# Patient Record
Sex: Female | Born: 1945 | Race: White | Hispanic: No | Marital: Married | Smoking: Former smoker
Health system: Southern US, Community
[De-identification: ages and names within clinical notes are randomized; demographics above are authoritative.]

## PROBLEM LIST (undated history)

## (undated) DIAGNOSIS — I4510 Unspecified right bundle-branch block: Secondary | ICD-10-CM

## (undated) DIAGNOSIS — R0989 Other specified symptoms and signs involving the circulatory and respiratory systems: Secondary | ICD-10-CM

## (undated) DIAGNOSIS — E785 Hyperlipidemia, unspecified: Secondary | ICD-10-CM

## (undated) DIAGNOSIS — H409 Unspecified glaucoma: Secondary | ICD-10-CM

## (undated) DIAGNOSIS — M47812 Spondylosis without myelopathy or radiculopathy, cervical region: Secondary | ICD-10-CM

## (undated) DIAGNOSIS — K219 Gastro-esophageal reflux disease without esophagitis: Secondary | ICD-10-CM

## (undated) DIAGNOSIS — M419 Scoliosis, unspecified: Secondary | ICD-10-CM

## (undated) DIAGNOSIS — M1990 Unspecified osteoarthritis, unspecified site: Secondary | ICD-10-CM

## (undated) DIAGNOSIS — R42 Dizziness and giddiness: Secondary | ICD-10-CM

## (undated) DIAGNOSIS — I493 Ventricular premature depolarization: Secondary | ICD-10-CM

## (undated) DIAGNOSIS — M8580 Other specified disorders of bone density and structure, unspecified site: Secondary | ICD-10-CM

## (undated) DIAGNOSIS — I10 Essential (primary) hypertension: Secondary | ICD-10-CM

## (undated) HISTORY — DX: Unspecified right bundle-branch block: I45.10

## (undated) HISTORY — DX: Unspecified osteoarthritis, unspecified site: M19.90

## (undated) HISTORY — DX: Hyperlipidemia, unspecified: E78.5

## (undated) HISTORY — DX: Essential (primary) hypertension: I10

## (undated) HISTORY — PX: BREAST BIOPSY: SHX20

## (undated) HISTORY — DX: Dizziness and giddiness: R42

## (undated) HISTORY — DX: Spondylosis without myelopathy or radiculopathy, cervical region: M47.812

## (undated) HISTORY — DX: Unspecified glaucoma: H40.9

## (undated) HISTORY — DX: Scoliosis, unspecified: M41.9

## (undated) HISTORY — DX: Ventricular premature depolarization: I49.3

## (undated) HISTORY — DX: Other specified symptoms and signs involving the circulatory and respiratory systems: R09.89

## (undated) HISTORY — DX: Gastro-esophageal reflux disease without esophagitis: K21.9

## (undated) HISTORY — DX: Other specified disorders of bone density and structure, unspecified site: M85.80

---

## 1967-10-01 HISTORY — PX: OOPHORECTOMY: SHX86

## 1999-05-18 ENCOUNTER — Other Ambulatory Visit: Admission: RE | Admit: 1999-05-18 | Discharge: 1999-05-18 | Payer: Self-pay | Admitting: Obstetrics and Gynecology

## 2000-05-15 ENCOUNTER — Other Ambulatory Visit: Admission: RE | Admit: 2000-05-15 | Discharge: 2000-05-15 | Payer: Self-pay | Admitting: Obstetrics and Gynecology

## 2001-07-17 ENCOUNTER — Other Ambulatory Visit: Admission: RE | Admit: 2001-07-17 | Discharge: 2001-07-17 | Payer: Self-pay | Admitting: Obstetrics and Gynecology

## 2002-08-06 ENCOUNTER — Other Ambulatory Visit: Admission: RE | Admit: 2002-08-06 | Discharge: 2002-08-06 | Payer: Self-pay | Admitting: Obstetrics and Gynecology

## 2002-09-30 HISTORY — PX: ROTATOR CUFF REPAIR: SHX139

## 2003-08-08 ENCOUNTER — Other Ambulatory Visit: Admission: RE | Admit: 2003-08-08 | Discharge: 2003-08-08 | Payer: Self-pay | Admitting: Obstetrics and Gynecology

## 2004-08-15 ENCOUNTER — Other Ambulatory Visit: Admission: RE | Admit: 2004-08-15 | Discharge: 2004-08-15 | Payer: Self-pay | Admitting: Obstetrics and Gynecology

## 2004-11-04 ENCOUNTER — Emergency Department (HOSPITAL_COMMUNITY): Admission: EM | Admit: 2004-11-04 | Discharge: 2004-11-04 | Payer: Self-pay | Admitting: Emergency Medicine

## 2005-08-19 ENCOUNTER — Other Ambulatory Visit: Admission: RE | Admit: 2005-08-19 | Discharge: 2005-08-19 | Payer: Self-pay | Admitting: Obstetrics and Gynecology

## 2006-04-10 ENCOUNTER — Encounter (INDEPENDENT_AMBULATORY_CARE_PROVIDER_SITE_OTHER): Payer: Self-pay | Admitting: Specialist

## 2006-04-10 ENCOUNTER — Encounter: Admission: RE | Admit: 2006-04-10 | Discharge: 2006-04-10 | Payer: Self-pay | Admitting: Internal Medicine

## 2006-09-04 ENCOUNTER — Other Ambulatory Visit: Admission: RE | Admit: 2006-09-04 | Discharge: 2006-09-04 | Payer: Self-pay | Admitting: Obstetrics & Gynecology

## 2006-10-09 ENCOUNTER — Encounter: Admission: RE | Admit: 2006-10-09 | Discharge: 2006-10-09 | Payer: Self-pay | Admitting: Internal Medicine

## 2007-05-05 ENCOUNTER — Encounter: Admission: RE | Admit: 2007-05-05 | Discharge: 2007-05-05 | Payer: Self-pay | Admitting: Internal Medicine

## 2007-06-04 ENCOUNTER — Ambulatory Visit: Payer: Self-pay | Admitting: *Deleted

## 2007-09-29 ENCOUNTER — Other Ambulatory Visit: Admission: RE | Admit: 2007-09-29 | Discharge: 2007-09-29 | Payer: Self-pay | Admitting: Obstetrics and Gynecology

## 2007-11-05 ENCOUNTER — Encounter: Admission: RE | Admit: 2007-11-05 | Discharge: 2007-11-05 | Payer: Self-pay | Admitting: Internal Medicine

## 2008-06-02 ENCOUNTER — Encounter: Admission: RE | Admit: 2008-06-02 | Discharge: 2008-06-02 | Payer: Self-pay | Admitting: Internal Medicine

## 2008-11-22 ENCOUNTER — Other Ambulatory Visit: Admission: RE | Admit: 2008-11-22 | Discharge: 2008-11-22 | Payer: Self-pay | Admitting: Obstetrics & Gynecology

## 2009-07-12 ENCOUNTER — Ambulatory Visit: Payer: Self-pay | Admitting: Vascular Surgery

## 2009-07-18 ENCOUNTER — Encounter: Admission: RE | Admit: 2009-07-18 | Discharge: 2009-07-18 | Payer: Self-pay | Admitting: Internal Medicine

## 2009-09-30 DIAGNOSIS — H409 Unspecified glaucoma: Secondary | ICD-10-CM

## 2009-09-30 HISTORY — DX: Unspecified glaucoma: H40.9

## 2010-01-15 ENCOUNTER — Ambulatory Visit: Payer: Self-pay | Admitting: Vascular Surgery

## 2010-07-25 ENCOUNTER — Encounter: Admission: RE | Admit: 2010-07-25 | Discharge: 2010-07-25 | Payer: Self-pay | Admitting: Internal Medicine

## 2011-02-12 NOTE — Procedures (Signed)
CAROTID DUPLEX EXAM   INDICATION:  Follow up carotid artery disease.   HISTORY:  Diabetes:  No.  Cardiac:  Murmur.  Hypertension:  Off meds now, per patient.  Smoking:  Previous.  Previous Surgery:  No.  CV History:  No.  Amaurosis Fugax No, Paresthesias No, Hemiparesis No.                                       RIGHT             LEFT  Brachial systolic pressure:         152               150  Brachial Doppler waveforms:         WNL               WNL  Vertebral direction of flow:        Antegrade         Antegrade  DUPLEX VELOCITIES (cm/sec)  CCA peak systolic                   100               105  ECA peak systolic                   134               101  ICA peak systolic                   P = 91, D = 243   P = 92, M = 176  ICA end diastolic                   P = 33, D = 78    P = 29, M = 57  PLAQUE MORPHOLOGY:                  Calcific          Calcific  PLAQUE AMOUNT:                      Very mild         Very mild  PLAQUE LOCATION:                    Bifurcation       Bifurcation   IMPRESSION:  1. Distal right internal carotid artery velocities are suggestive of      60% to 79% stenosis.  2. Mid/distal left internal carotid artery velocities are suggestive      of 40% to 59% stenosis.  3. No significant plaque visualized.  4. No significant changes from previous study.  5. Questionable FMD versus tortuous vessels.       ___________________________________________  Janetta Hora Fields, MD   AS/MEDQ  D:  01/15/2010  T:  01/15/2010  Job:  [id number]

## 2011-02-12 NOTE — Procedures (Signed)
CAROTID DUPLEX EXAM   INDICATION:  Followup carotid artery disease.   HISTORY:  Diabetes:  No.  Cardiac:  Murmur.  Hypertension:  Yes.  Smoking:  Quit 20 years ago.  Previous Surgery:  No.  CV History:  No.  Amaurosis Fugax No, Paresthesias No, Hemiparesis No                                       RIGHT             LEFT  Brachial systolic pressure:         138               132  Brachial Doppler waveforms:         Within normal limits                Within normal limits  Vertebral direction of flow:        Antegrade         Antegrade  DUPLEX VELOCITIES (cm/sec)  CCA peak systolic                   94                86  ECA peak systolic                   83                82  ICA peak systolic                   215 distal        196 mid to distal  ICA end diastolic                   48                61  PLAQUE MORPHOLOGY:                  Calcified         Calcified  PLAQUE AMOUNT:                      Very mild         Very mild  PLAQUE LOCATION:                    Below             Below   IMPRESSION:  Right internal carotid artery suggests 60%-79% stenosis on  the low range.  Left internal carotid artery suggests 40%-59% stenosis.  Antegrade flow in both vertebrals.   ___________________________________________  Janetta Hora Fields, MD   CB/MEDQ  D:  07/12/2009  T:  07/12/2009  Job:  [id number]

## 2011-02-12 NOTE — Procedures (Signed)
CAROTID DUPLEX EXAM   INDICATION:  Right carotid bruit.   HISTORY:  Diabetes:  No.  Cardiac:  No.  Hypertension:  Yes.  Smoking:  Quit 20 years ago.  Previous Surgery:  CV History:  Amaurosis Fugax No, Paresthesias No, Hemiparesis No                                       RIGHT             LEFT  Brachial systolic pressure:         130               130  Brachial Doppler waveforms:         Biphasic          Biphasic  Vertebral direction of flow:        Antegrade         Antegrade  DUPLEX VELOCITIES (cm/sec)  CCA peak systolic                   71                79  ECA peak systolic                   79                90  ICA peak systolic                   180 (distal)      183 (mid-to-  distal)  ICA end diastolic                   53                55  PLAQUE MORPHOLOGY:                  None              None  PLAQUE AMOUNT:                      None              None  PLAQUE LOCATION:                    None              None   IMPRESSION:  A 40-59% stenosis noted in bilateral internal carotid  arteries.  Questionable FMD.  Antegrade bilateral vertebral arteries.   ___________________________________________  Di Kindle. Edilia Bo, M.D.   MG/MEDQ  D:  06/04/2007  T:  06/04/2007  Job:  [id number]

## 2011-07-04 ENCOUNTER — Encounter: Payer: Self-pay | Admitting: Cardiovascular Disease

## 2011-07-04 ENCOUNTER — Encounter (INDEPENDENT_AMBULATORY_CARE_PROVIDER_SITE_OTHER): Payer: Medicare Other

## 2011-07-04 ENCOUNTER — Ambulatory Visit (INDEPENDENT_AMBULATORY_CARE_PROVIDER_SITE_OTHER): Payer: Medicare Other | Admitting: Cardiovascular Disease

## 2011-07-04 DIAGNOSIS — R002 Palpitations: Secondary | ICD-10-CM

## 2011-07-04 DIAGNOSIS — I10 Essential (primary) hypertension: Secondary | ICD-10-CM

## 2011-07-04 DIAGNOSIS — R0989 Other specified symptoms and signs involving the circulatory and respiratory systems: Secondary | ICD-10-CM | POA: Insufficient documentation

## 2011-07-04 DIAGNOSIS — I452 Bifascicular block: Secondary | ICD-10-CM

## 2011-07-04 DIAGNOSIS — R0600 Dyspnea, unspecified: Secondary | ICD-10-CM

## 2011-07-04 DIAGNOSIS — E782 Mixed hyperlipidemia: Secondary | ICD-10-CM

## 2011-07-04 DIAGNOSIS — R0609 Other forms of dyspnea: Secondary | ICD-10-CM

## 2011-07-04 NOTE — Assessment & Plan Note (Signed)
Cholesterol is at goal.  Continue current dose of statin and diet Rx.  No myalgias or side effects.  F/U  LFT's in 6 months. No results found for this basename: LDLCALC   F/U labs Perrini.  TC ? 168 per patient

## 2011-07-04 NOTE — Progress Notes (Signed)
65 yo referred by Dr Marveen Reeks for palpitations.  History of borderline BP, elevated lipids.  Carotids followed by Dr Darrick Penna.  ? FMD  Last duplex reviewed  01/15/10 with peak RICA velocity 2.4 m/sec and left 1.22m/sec.  Previous HTN.  Lopresser stopped 3 years ago.  Lost 25lbs when she retired and did not need meds anymore.  Elevated lipids well controlled on zocor.  Recent labs with primary ok.  Previous palpitations many years ago.  Returned the last 3 weeks.  Daily.  Not necessarily worse with exercise.  Can make her some dyspnic.  No syncope.  Some pressure in chest but not pain.  Goes to [employer] regularly and not worse there.  Has tried to cut back on caffeine.  ROS: Denies fever, malais, weight loss, blurry vision, decreased visual acuity, cough, sputum, SOB, hemoptysis, pleuritic pain, palpitaitons, heartburn, abdominal pain, melena, lower extremity edema, claudication, or rash.  All other systems reviewed and negative   General: Affect appropriate Healthy:  appears stated age HEENT: normal Neck supple with no adenopathy JVP normal right  bruits no thyromegaly Lungs clear with no wheezing and good diaphragmatic motion Heart:  S1/S2 no murmur,rub, gallop or click PMI normal Abdomen: benighn, BS positve, no tenderness, no AAA no bruit.  No HSM or HJR Distal pulses intact with no bruits No edema Neuro non-focal Skin warm and dry No muscular weakness  Medications Current Outpatient Prescriptions  Medication Sig Dispense Refill  . aspirin 81 MG tablet Take 81 mg by mouth daily.        . calcium carbonate 200 MG capsule Take 250 mg by mouth daily.        Marland Kitchen estradiol (ESTRACE VAGINAL) 0.1 MG/GM vaginal cream Place 2 g vaginally.        . fish oil-omega-3 fatty acids 1000 MG capsule Take 2 g by mouth. Once a week      . Glucosamine HCl 1000 MG TABS Take 1 tablet by mouth daily.        Marland Kitchen latanoprost (XALATAN) 0.005 % ophthalmic solution Place 1 drop into both eyes at bedtime.          . simvastatin (ZOCOR) 20 MG tablet Take 20 mg by mouth at bedtime.          Allergies Penicillins and Sulfa antibiotics  Family History: No family history on file.  Social History: History   Social History  . Marital Status: Married    Spouse Name: N/A    Number of Children: 2  . Years of Education: N/A   Occupational History  . LPN [employer]   Social History Main Topics  . Smoking status: Former Smoker    Quit date: 10/03/1985  . Smokeless tobacco: Not on file  . Alcohol Use: Yes  . Drug Use: No  . Sexually Active: Not on file   Other Topics Concern  . Not on file   Social History Narrative   Works out at [employer] 5/weekFormer smokerOccasional ETOHMarried    Electrocardiogram:  NSR 77 Biatrial enlargement RBBB  Assessment and Plan

## 2011-07-04 NOTE — Assessment & Plan Note (Signed)
Well controlled.  Continue current medications and low sodium Dash type diet.  Monitor at home

## 2011-07-04 NOTE — Assessment & Plan Note (Signed)
Will do duplex in our office.  ? FMD or tortuous vessels.  Assess intimal thickening

## 2011-07-04 NOTE — Patient Instructions (Signed)
Your physician recommends that you schedule a follow-up appointment in: 3-4 WEEKS AFTER TEST DONE WITH DR Eden Emms  Your physician recommends that you continue on your current medications as directed. Please refer to the Current Medication list given to you today.  Your physician has recommended that you wear an event monitor. Event monitors are medical devices that record the heart's electrical activity. Doctors most often [place] these monitors to diagnose arrhythmias. Arrhythmias are problems with the speed or rhythm of the heartbeat. The monitor is a small, portable device. You can wear one while you do your normal daily activities. This is usually used to diagnose what is causing palpitations/syncope (passing out). DX 785.1 Your physician has requested that you have a carotid duplex. This test is an ultrasound of the carotid arteries in your neck. It looks at blood flow through these arteries that supply the brain with blood. Allow one hour for this exam. There are no restrictions or special instructions. DX BRUIT  Your physician has requested that you have a stress echocardiogram. For further information please visit [email or web address] Please follow instruction sheet as given. DX 785.1  DYSPNEA

## 2011-07-04 NOTE — Assessment & Plan Note (Signed)
Event monitor and stress echo to assess and R/O structural heart disease

## 2011-07-04 NOTE — Assessment & Plan Note (Signed)
Will try to get old records.  Echo monitor for palpitations  No high grade block

## 2011-07-12 ENCOUNTER — Other Ambulatory Visit: Payer: Self-pay | Admitting: Internal Medicine

## 2011-07-12 DIAGNOSIS — Z1231 Encounter for screening mammogram for malignant neoplasm of breast: Secondary | ICD-10-CM

## 2011-07-29 ENCOUNTER — Encounter: Payer: Self-pay | Admitting: *Deleted

## 2011-07-30 ENCOUNTER — Encounter (INDEPENDENT_AMBULATORY_CARE_PROVIDER_SITE_OTHER): Payer: Medicare Other | Admitting: *Deleted

## 2011-07-30 ENCOUNTER — Other Ambulatory Visit (HOSPITAL_COMMUNITY): Payer: BLUE CROSS/BLUE SHIELD | Admitting: Radiology

## 2011-07-30 ENCOUNTER — Encounter: Payer: BLUE CROSS/BLUE SHIELD | Admitting: *Deleted

## 2011-07-30 ENCOUNTER — Ambulatory Visit (HOSPITAL_COMMUNITY): Payer: Medicare Other | Attending: Cardiovascular Disease | Admitting: Radiology

## 2011-07-30 ENCOUNTER — Other Ambulatory Visit (HOSPITAL_COMMUNITY): Payer: BLUE CROSS/BLUE SHIELD

## 2011-07-30 DIAGNOSIS — E785 Hyperlipidemia, unspecified: Secondary | ICD-10-CM | POA: Insufficient documentation

## 2011-07-30 DIAGNOSIS — R0989 Other specified symptoms and signs involving the circulatory and respiratory systems: Secondary | ICD-10-CM

## 2011-07-30 DIAGNOSIS — R000 Tachycardia, unspecified: Secondary | ICD-10-CM | POA: Insufficient documentation

## 2011-07-30 DIAGNOSIS — R002 Palpitations: Secondary | ICD-10-CM | POA: Insufficient documentation

## 2011-07-30 DIAGNOSIS — R072 Precordial pain: Secondary | ICD-10-CM | POA: Insufficient documentation

## 2011-07-30 DIAGNOSIS — I6529 Occlusion and stenosis of unspecified carotid artery: Secondary | ICD-10-CM

## 2011-07-30 DIAGNOSIS — R5381 Other malaise: Secondary | ICD-10-CM | POA: Insufficient documentation

## 2011-07-30 DIAGNOSIS — I10 Essential (primary) hypertension: Secondary | ICD-10-CM | POA: Insufficient documentation

## 2011-07-30 DIAGNOSIS — Z87891 Personal history of nicotine dependence: Secondary | ICD-10-CM | POA: Insufficient documentation

## 2011-07-30 DIAGNOSIS — Z8249 Family history of ischemic heart disease and other diseases of the circulatory system: Secondary | ICD-10-CM | POA: Insufficient documentation

## 2011-07-30 DIAGNOSIS — R5383 Other fatigue: Secondary | ICD-10-CM | POA: Insufficient documentation

## 2011-07-31 ENCOUNTER — Encounter: Payer: Self-pay | Admitting: *Deleted

## 2011-07-31 ENCOUNTER — Encounter: Payer: Self-pay | Admitting: Cardiovascular Disease

## 2011-07-31 ENCOUNTER — Ambulatory Visit (INDEPENDENT_AMBULATORY_CARE_PROVIDER_SITE_OTHER): Payer: Medicare Other | Admitting: Cardiovascular Disease

## 2011-07-31 DIAGNOSIS — R002 Palpitations: Secondary | ICD-10-CM

## 2011-07-31 DIAGNOSIS — E782 Mixed hyperlipidemia: Secondary | ICD-10-CM

## 2011-07-31 DIAGNOSIS — I10 Essential (primary) hypertension: Secondary | ICD-10-CM

## 2011-07-31 DIAGNOSIS — R0989 Other specified symptoms and signs involving the circulatory and respiratory systems: Secondary | ICD-10-CM

## 2011-07-31 NOTE — Assessment & Plan Note (Signed)
Well controlled.  Continue current medications and low sodium Dash type diet.    

## 2011-07-31 NOTE — Telephone Encounter (Signed)
This encounter was created in error - please disregard.

## 2011-07-31 NOTE — Assessment & Plan Note (Signed)
Benign with normal event monitor and stress echo. Observe

## 2011-07-31 NOTE — Patient Instructions (Signed)
Your physician recommends that you schedule a follow-up appointment in: PRN 

## 2011-07-31 NOTE — Assessment & Plan Note (Signed)
Cholesterol is at goal.  Continue current dose of statin and diet Rx.  No myalgias or side effects.  F/U  LFT's in 6 months. No results found for this basename: LDLCALC             

## 2011-07-31 NOTE — Assessment & Plan Note (Signed)
F/U duplex in a year ? FMD  ASA

## 2011-07-31 NOTE — Progress Notes (Signed)
65 yo referred by Dr Marveen Reeks for palpitations. History of borderline BP, elevated lipids. Carotids followed by Dr Darrick Penna. ? FMD Last duplex reviewed 01/15/10 with peak RICA velocity 2.4 m/sec and left 1.2m/sec. Previous HTN. Lopresser stopped 3 years ago. Lost 25lbs when she retired and did not need meds anymore. Elevated lipids well controlled on zocor. Recent labs with primary ok. Previous palpitations many years ago. Returned the last 3 weeks. Daily. Not necessarily worse with exercise. Can make her some dyspnic. No syncope. Some pressure in chest but not pain. Goes to [employer] regularly and not worse there. Has tried to cut back on caffeine.  Normal stress echo 10/30  Reviewed Event monitor reviewed 10/4-10/24  No arrhythmia  Even with palpitations NSR  Since last visit "spells" lest often  Feels heart beating hard.  ROS: Denies fever, malais, weight loss, blurry vision, decreased visual acuity, cough, sputum, SOB, hemoptysis, pleuritic pain, palpitaitons, heartburn, abdominal pain, melena, lower extremity edema, claudication, or rash.  All other systems reviewed and negative  General: Affect appropriate Healthy:  appears stated age HEENT: normal Neck supple with no adenopathy JVP normal no bruits no thyromegaly Lungs clear with no wheezing and good diaphragmatic motion Heart:  S1/S2 no murmur,rub, gallop or click PMI normal Abdomen: benighn, BS positve, no tenderness, no AAA no bruit.  No HSM or HJR Distal pulses intact with no bruits No edema Neuro non-focal Skin warm and dry No muscular weakness   Current Outpatient Prescriptions  Medication Sig Dispense Refill  . aspirin 81 MG tablet Take 81 mg by mouth daily.        . calcium carbonate 200 MG capsule Take 250 mg by mouth daily.        Marland Kitchen estradiol (ESTRACE VAGINAL) 0.1 MG/GM vaginal cream Place 2 g vaginally.        Marland Kitchen estrogen, conjugated,-medroxyprogesterone (PREMPRO) 0.3-1.5 MG per tablet Take 1 tablet by mouth daily.         . fish oil-omega-3 fatty acids 1000 MG capsule Take 2 g by mouth. Once a week      . latanoprost (XALATAN) 0.005 % ophthalmic solution Place 1 drop into both eyes at bedtime.        . Multiple Vitamin (MULTIVITAMIN) capsule Take 1 capsule by mouth daily.        . simvastatin (ZOCOR) 20 MG tablet Take 20 mg by mouth at bedtime.          Allergies  Penicillins and Sulfa antibiotics  Electrocardiogram:  Assessment and Plan

## 2011-08-08 ENCOUNTER — Ambulatory Visit
Admission: RE | Admit: 2011-08-08 | Discharge: 2011-08-08 | Disposition: A | Discharge status: Home / Self Care | Payer: Medicare Other | Source: Ambulatory Visit | Attending: Internal Medicine | Admitting: Internal Medicine

## 2011-08-08 DIAGNOSIS — Z1231 Encounter for screening mammogram for malignant neoplasm of breast: Secondary | ICD-10-CM

## 2011-10-08 DIAGNOSIS — E785 Hyperlipidemia, unspecified: Secondary | ICD-10-CM | POA: Diagnosis not present

## 2011-10-08 DIAGNOSIS — M899 Disorder of bone, unspecified: Secondary | ICD-10-CM | POA: Diagnosis not present

## 2011-10-08 DIAGNOSIS — M949 Disorder of cartilage, unspecified: Secondary | ICD-10-CM | POA: Diagnosis not present

## 2011-10-08 DIAGNOSIS — I10 Essential (primary) hypertension: Secondary | ICD-10-CM | POA: Diagnosis not present

## 2011-10-15 DIAGNOSIS — Z0000 Encounter for general adult medical examination without abnormal findings: Secondary | ICD-10-CM | POA: Diagnosis not present

## 2011-10-15 DIAGNOSIS — E785 Hyperlipidemia, unspecified: Secondary | ICD-10-CM | POA: Diagnosis not present

## 2011-10-15 DIAGNOSIS — N951 Menopausal and female climacteric states: Secondary | ICD-10-CM | POA: Diagnosis not present

## 2011-10-15 DIAGNOSIS — H6120 Impacted cerumen, unspecified ear: Secondary | ICD-10-CM | POA: Diagnosis not present

## 2011-10-15 DIAGNOSIS — Z23 Encounter for immunization: Secondary | ICD-10-CM | POA: Diagnosis not present

## 2011-10-22 DIAGNOSIS — H4011X0 Primary open-angle glaucoma, stage unspecified: Secondary | ICD-10-CM | POA: Diagnosis not present

## 2011-11-29 LAB — HM DEXA SCAN

## 2011-12-10 DIAGNOSIS — M899 Disorder of bone, unspecified: Secondary | ICD-10-CM | POA: Diagnosis not present

## 2011-12-10 DIAGNOSIS — M949 Disorder of cartilage, unspecified: Secondary | ICD-10-CM | POA: Diagnosis not present

## 2011-12-23 DIAGNOSIS — Z0000 Encounter for general adult medical examination without abnormal findings: Secondary | ICD-10-CM | POA: Diagnosis not present

## 2011-12-23 DIAGNOSIS — Z124 Encounter for screening for malignant neoplasm of cervix: Secondary | ICD-10-CM | POA: Diagnosis not present

## 2011-12-23 DIAGNOSIS — Z01419 Encounter for gynecological examination (general) (routine) without abnormal findings: Secondary | ICD-10-CM | POA: Diagnosis not present

## 2012-02-26 DIAGNOSIS — H409 Unspecified glaucoma: Secondary | ICD-10-CM | POA: Diagnosis not present

## 2012-02-26 DIAGNOSIS — H4011X0 Primary open-angle glaucoma, stage unspecified: Secondary | ICD-10-CM | POA: Diagnosis not present

## 2012-07-14 DIAGNOSIS — H409 Unspecified glaucoma: Secondary | ICD-10-CM | POA: Diagnosis not present

## 2012-07-14 DIAGNOSIS — H4011X0 Primary open-angle glaucoma, stage unspecified: Secondary | ICD-10-CM | POA: Diagnosis not present

## 2012-07-27 DIAGNOSIS — J069 Acute upper respiratory infection, unspecified: Secondary | ICD-10-CM | POA: Diagnosis not present

## 2012-07-27 DIAGNOSIS — J309 Allergic rhinitis, unspecified: Secondary | ICD-10-CM | POA: Diagnosis not present

## 2012-07-28 ENCOUNTER — Other Ambulatory Visit: Payer: Self-pay | Admitting: Internal Medicine

## 2012-07-28 DIAGNOSIS — Z1231 Encounter for screening mammogram for malignant neoplasm of breast: Secondary | ICD-10-CM

## 2012-08-10 DIAGNOSIS — K5730 Diverticulosis of large intestine without perforation or abscess without bleeding: Secondary | ICD-10-CM | POA: Diagnosis not present

## 2012-08-10 DIAGNOSIS — K5289 Other specified noninfective gastroenteritis and colitis: Secondary | ICD-10-CM | POA: Diagnosis not present

## 2012-08-10 DIAGNOSIS — K921 Melena: Secondary | ICD-10-CM | POA: Diagnosis not present

## 2012-08-10 DIAGNOSIS — K219 Gastro-esophageal reflux disease without esophagitis: Secondary | ICD-10-CM | POA: Diagnosis not present

## 2012-08-20 DIAGNOSIS — R198 Other specified symptoms and signs involving the digestive system and abdomen: Secondary | ICD-10-CM | POA: Diagnosis not present

## 2012-08-20 DIAGNOSIS — R197 Diarrhea, unspecified: Secondary | ICD-10-CM | POA: Diagnosis not present

## 2012-09-04 DIAGNOSIS — Z23 Encounter for immunization: Secondary | ICD-10-CM | POA: Diagnosis not present

## 2012-09-07 ENCOUNTER — Ambulatory Visit
Admission: RE | Admit: 2012-09-07 | Discharge: 2012-09-07 | Disposition: A | Discharge status: Home / Self Care | Payer: Medicare Other | Source: Ambulatory Visit | Attending: Internal Medicine | Admitting: Internal Medicine

## 2012-09-07 DIAGNOSIS — Z1231 Encounter for screening mammogram for malignant neoplasm of breast: Secondary | ICD-10-CM

## 2012-09-30 HISTORY — PX: CATARACT EXTRACTION: SUR2

## 2012-11-18 ENCOUNTER — Encounter: Payer: Self-pay | Admitting: Nurse Practitioner

## 2012-11-18 DIAGNOSIS — H4011X0 Primary open-angle glaucoma, stage unspecified: Secondary | ICD-10-CM | POA: Diagnosis not present

## 2012-11-18 DIAGNOSIS — H52209 Unspecified astigmatism, unspecified eye: Secondary | ICD-10-CM | POA: Diagnosis not present

## 2012-11-18 DIAGNOSIS — H2510 Age-related nuclear cataract, unspecified eye: Secondary | ICD-10-CM | POA: Diagnosis not present

## 2012-11-18 DIAGNOSIS — M1990 Unspecified osteoarthritis, unspecified site: Secondary | ICD-10-CM | POA: Insufficient documentation

## 2012-11-18 DIAGNOSIS — H409 Unspecified glaucoma: Secondary | ICD-10-CM | POA: Diagnosis not present

## 2012-11-23 DIAGNOSIS — M5137 Other intervertebral disc degeneration, lumbosacral region: Secondary | ICD-10-CM | POA: Diagnosis not present

## 2012-11-23 DIAGNOSIS — M949 Disorder of cartilage, unspecified: Secondary | ICD-10-CM | POA: Diagnosis not present

## 2012-11-23 DIAGNOSIS — Z043 Encounter for examination and observation following other accident: Secondary | ICD-10-CM | POA: Diagnosis not present

## 2012-11-23 DIAGNOSIS — M899 Disorder of bone, unspecified: Secondary | ICD-10-CM | POA: Diagnosis not present

## 2012-12-14 DIAGNOSIS — E785 Hyperlipidemia, unspecified: Secondary | ICD-10-CM | POA: Diagnosis not present

## 2012-12-14 DIAGNOSIS — M949 Disorder of cartilage, unspecified: Secondary | ICD-10-CM | POA: Diagnosis not present

## 2012-12-14 DIAGNOSIS — I10 Essential (primary) hypertension: Secondary | ICD-10-CM | POA: Diagnosis not present

## 2012-12-14 DIAGNOSIS — M899 Disorder of bone, unspecified: Secondary | ICD-10-CM | POA: Diagnosis not present

## 2012-12-14 DIAGNOSIS — I739 Peripheral vascular disease, unspecified: Secondary | ICD-10-CM | POA: Diagnosis not present

## 2012-12-14 DIAGNOSIS — Z79899 Other long term (current) drug therapy: Secondary | ICD-10-CM | POA: Diagnosis not present

## 2012-12-21 DIAGNOSIS — Z0000 Encounter for general adult medical examination without abnormal findings: Secondary | ICD-10-CM | POA: Diagnosis not present

## 2012-12-21 DIAGNOSIS — M545 Low back pain: Secondary | ICD-10-CM | POA: Diagnosis not present

## 2012-12-21 DIAGNOSIS — R002 Palpitations: Secondary | ICD-10-CM | POA: Diagnosis not present

## 2012-12-21 DIAGNOSIS — K5730 Diverticulosis of large intestine without perforation or abscess without bleeding: Secondary | ICD-10-CM | POA: Diagnosis not present

## 2012-12-21 DIAGNOSIS — Z79899 Other long term (current) drug therapy: Secondary | ICD-10-CM | POA: Diagnosis not present

## 2012-12-21 DIAGNOSIS — I6529 Occlusion and stenosis of unspecified carotid artery: Secondary | ICD-10-CM | POA: Diagnosis not present

## 2012-12-21 DIAGNOSIS — I4510 Unspecified right bundle-branch block: Secondary | ICD-10-CM | POA: Diagnosis not present

## 2012-12-21 DIAGNOSIS — K219 Gastro-esophageal reflux disease without esophagitis: Secondary | ICD-10-CM | POA: Diagnosis not present

## 2012-12-21 DIAGNOSIS — J309 Allergic rhinitis, unspecified: Secondary | ICD-10-CM | POA: Diagnosis not present

## 2012-12-29 ENCOUNTER — Encounter: Payer: Self-pay | Admitting: Nurse Practitioner

## 2012-12-29 ENCOUNTER — Ambulatory Visit (INDEPENDENT_AMBULATORY_CARE_PROVIDER_SITE_OTHER): Payer: Medicare Other | Admitting: Nurse Practitioner

## 2012-12-29 VITALS — BP 118/68 | HR 70 | Resp 16 | Ht 63.75 in | Wt 147.0 lb

## 2012-12-29 DIAGNOSIS — N952 Postmenopausal atrophic vaginitis: Secondary | ICD-10-CM | POA: Diagnosis not present

## 2012-12-29 DIAGNOSIS — N951 Menopausal and female climacteric states: Secondary | ICD-10-CM

## 2012-12-29 DIAGNOSIS — Z01419 Encounter for gynecological examination (general) (routine) without abnormal findings: Secondary | ICD-10-CM | POA: Diagnosis not present

## 2012-12-29 MED ORDER — ESTRADIOL 0.1 MG/GM VA CREA: TOPICAL_CREAM | VAGINAL | Status: DC

## 2012-12-29 MED ORDER — CONJ ESTROG-MEDROXYPROGEST ACE 0.3-1.5 MG PO TABS
1.0000 | ORAL_TABLET | Freq: Every day | ORAL | Status: DC
Start: 2012-12-29 — End: 2014-01-04

## 2012-12-29 NOTE — Patient Instructions (Addendum)
Atrophic Vaginitis Atrophic vaginitis is a problem of low levels of estrogen in women. This problem can happen at any age. It is most common in women who have gone through menopause ("the change").  HOW WILL I KNOW IF I HAVE THIS PROBLEM? You may have:  Trouble with peeing (urinating), such as:  Going to the bathroom often.  A hard time holding your pee until you reach a bathroom.  Leaking pee.  Having pain when you pee.  Itching or a burning feeling.  Vaginal bleeding and spotting.  Pain during sex.  Dryness of the vagina.  A yellow, bad-smelling fluid (discharge) coming from the vagina. HOW WILL MY DOCTOR CHECK FOR THIS PROBLEM?  During your exam, your doctor will likely find the problem.  If there is a vaginal fluid, it may be checked for infection. HOW WILL THIS PROBLEM BE TREATED? Keep the vulvar skin as clean as possible. Moisturizers and lubricants can help with some of the symptoms. Estrogen replacement can help. There are 2 ways to take estrogen:  Systemic estrogen gets estrogen to your whole body. It takes many weeks or months before the symptoms get better.  You take an estrogen pill.  You use a skin patch. This is a patch that you put on your skin.  If you still have your uterus, your doctor may ask you to take a hormone. Talk to your doctor about the right medicine for you.  Estrogen cream.  This puts estrogen only at the part of your body where you apply it. The cream is put into the vagina or put on the vulvar skin. For some women, estrogen cream works faster than pills or the patch. CAN ALL WOMEN WITH THIS PROBLEM USE ESTROGEN? No. Women with certain types of cancer, liver problems, or problems with blood clots should not take estrogen. Your doctor can help you decide the best treatment for your symptoms. Document Released: 03/04/2008 Document Revised: 12/09/2011 Document Reviewed: 03/04/2008 ExitCare Patient Information 2013 [employer].  

## 2012-12-29 NOTE — Progress Notes (Signed)
67 y.o. MarriedCaucasian female   [phone] here for annual exam.  Has been trying to decrease oral estrogen to every other day and wean off, but has had an increase in mood changes that she went back to regular dose. She had a bout of colitis and had to take antibiotics in  Nov. 2013.  Also related to stress.  Her daughter has an eating disorder and is living with them and the grandchild until she gets therapy. The goal is to get her back living with the husband.  No LMP recorded. Patient is postmenopausal.          Sexually active: no  The current method of family planning is none.    Exercising: no  none Last mammogram: 09/07/12 Last pap: 12/23/11 Last BMD: 11/2011 Osteopenia off Fosamax since 2007 Alcohol: occ. wine Tobacco: No   Health Maintenance  Topic Date Due  . Influenza Vaccine  05/31/1946  . Tetanus/tdap  01/17/1963  . Colonoscopy  01/16/1994  . Mammogram  09/07/2014  . Pneumococcal Polysaccharide Vaccine Age 68 And Over  Completed  . Zostavax  Completed    No family history on file.  Patient Active Problem List  Diagnosis  . Bruit  . HTN (hypertension)  . Mixed hyperlipidemia  . RBBB (right bundle branch block with left anterior fascicular block)  . Palpitations  . Osteoarthritis    Past Medical History  Diagnosis Date  . Carotid bruit   . GERD (gastroesophageal reflux disease)   . Symptomatic PVCs   . Arthritis   . Elevated lipids   . RBBB (right bundle branch block)   . Hypertension   . Osteoarthritis     Past Surgical History  Procedure Laterality Date  . Appendectomy    . Rotator cuff repair Right 09/2002  . Breast biopsy    . Other surgical history      LSO-Dermoid  . Other surgical history Left 11/1995    Core Biopsy    Allergies: Erythromycin; Penicillins; and Sulfa antibiotics  Current Outpatient Prescriptions  Medication Sig Dispense Refill  . estradiol (ESTRACE VAGINAL) 0.1 MG/GM vaginal cream Place 2 g vaginally.        Marland Kitchen estrogen,  conjugated,-medroxyprogesterone (PREMPRO) 0.3-1.5 MG per tablet Take 1 tablet by mouth daily.        Marland Kitchen latanoprost (XALATAN) 0.005 % ophthalmic solution Place 1 drop into both eyes at bedtime.        . Multiple Vitamin (MULTIVITAMIN) capsule Take 1 capsule by mouth daily.        . simvastatin (ZOCOR) 20 MG tablet Take 20 mg by mouth at bedtime.        Marland Kitchen aspirin 81 MG tablet Take 81 mg by mouth daily.        . calcium carbonate 200 MG capsule Take 250 mg by mouth daily.        . fish oil-omega-3 fatty acids 1000 MG capsule Take 2 g by mouth. Once a week       No current facility-administered medications for this visit.    ROS: A comprehensive review of systems was negative.  Exam:    BP 118/68  Pulse 70  Resp 16  Ht 5' 3.75" (1.619 m)  Wt 147 lb (66.679 kg)  BMI 25.44 kg/m2 Weight change: @WEIGHTCHANGE @ Last 3 height recordings:  Ht Readings from Last 3 Encounters:  12/29/12 5' 3.75" (1.619 m)  07/31/11 5\' 4"  (1.626 m)  07/04/11 5' 4.5" (1.638 m)   General appearance: alert, cooperative,  appears stated age and anxious Head: Normocephalic, without obvious abnormality Neck: no adenopathy, supple, symmetrical, trachea midline and thyroid not enlarged, symmetric, no tenderness/mass/nodules Lungs: clear to auscultation bilaterally Breasts: normal appearance, no masses or tenderness Heart: regular rate and rhythm, S1, S2 normal, no murmur, click, rub or gallop Abdomen: soft, non-tender; bowel sounds normal; no masses,  no organomegaly Extremities: extremities normal, atraumatic, no cyanosis or edema Skin: Skin color, texture, turgor normal. No rashes or lesions Lymph nodes: Cervical, supraclavicular, and axillary nodes normal. no inguinal nodes palpated Neurologic: Grossly normal   Pelvic: External genitalia:  no lesions              Urethra: normal appearing urethra with no masses, tenderness or lesions              Bartholins and Skenes: normal                 Vagina: atrophic               Cervix: normal appearance              Pap taken: no        Bimanual Exam:  Uterus:  uterus is normal size, shape, consistency and nontender                                      Adnexa:    normal adnexa in size, nontender and no masses                                      Rectovaginal: Confirms                                      Anus:  normal sphincter tone, no lesions  A:  GYN well woman exam  Postmenopausal on HRT  no contraindication to continue hormonal therapy  Situational depression and anxiety  History of Bundle Branch Block     P:  Mammogram due 12/14  pap smear as per guidlines  counseled on breast self exam, mammography screening,  use and side effects of HRT, adequate intake of calcium and  vitamin D, diet and exercise, Kegel's exercises    Return annually or prn      An After Visit Summary was printed and given to the patient.

## 2013-01-03 NOTE — Progress Notes (Signed)
Encounter reviewed by Dr. Shareta Fishbaugh Silva.  

## 2013-06-21 DIAGNOSIS — H409 Unspecified glaucoma: Secondary | ICD-10-CM | POA: Diagnosis not present

## 2013-06-21 DIAGNOSIS — H4011X0 Primary open-angle glaucoma, stage unspecified: Secondary | ICD-10-CM | POA: Diagnosis not present

## 2013-06-30 ENCOUNTER — Encounter: Payer: Self-pay | Admitting: Nurse Practitioner

## 2013-07-14 DIAGNOSIS — Z23 Encounter for immunization: Secondary | ICD-10-CM | POA: Diagnosis not present

## 2013-07-19 DIAGNOSIS — H4011X0 Primary open-angle glaucoma, stage unspecified: Secondary | ICD-10-CM | POA: Diagnosis not present

## 2013-07-19 DIAGNOSIS — H409 Unspecified glaucoma: Secondary | ICD-10-CM | POA: Diagnosis not present

## 2013-09-01 DIAGNOSIS — H409 Unspecified glaucoma: Secondary | ICD-10-CM | POA: Diagnosis not present

## 2013-09-01 DIAGNOSIS — H4011X0 Primary open-angle glaucoma, stage unspecified: Secondary | ICD-10-CM | POA: Diagnosis not present

## 2013-09-15 ENCOUNTER — Other Ambulatory Visit: Payer: Self-pay

## 2013-09-15 DIAGNOSIS — Z1231 Encounter for screening mammogram for malignant neoplasm of breast: Secondary | ICD-10-CM

## 2013-09-21 ENCOUNTER — Ambulatory Visit
Admission: RE | Admit: 2013-09-21 | Discharge: 2013-09-21 | Disposition: A | Discharge status: Home / Self Care | Payer: Medicare Other | Source: Ambulatory Visit

## 2013-09-21 DIAGNOSIS — Z1231 Encounter for screening mammogram for malignant neoplasm of breast: Secondary | ICD-10-CM

## 2013-12-27 DIAGNOSIS — M899 Disorder of bone, unspecified: Secondary | ICD-10-CM | POA: Diagnosis not present

## 2013-12-27 DIAGNOSIS — M949 Disorder of cartilage, unspecified: Secondary | ICD-10-CM | POA: Diagnosis not present

## 2013-12-27 DIAGNOSIS — I10 Essential (primary) hypertension: Secondary | ICD-10-CM | POA: Diagnosis not present

## 2013-12-27 DIAGNOSIS — E785 Hyperlipidemia, unspecified: Secondary | ICD-10-CM | POA: Diagnosis not present

## 2013-12-31 ENCOUNTER — Ambulatory Visit: Payer: BC Managed Care – PPO | Admitting: Nurse Practitioner

## 2014-01-04 ENCOUNTER — Ambulatory Visit (INDEPENDENT_AMBULATORY_CARE_PROVIDER_SITE_OTHER): Payer: Medicare Other | Admitting: Nurse Practitioner

## 2014-01-04 ENCOUNTER — Encounter: Payer: Self-pay | Admitting: Nurse Practitioner

## 2014-01-04 VITALS — BP 106/64 | HR 64 | Ht 63.75 in | Wt 158.0 lb

## 2014-01-04 DIAGNOSIS — N951 Menopausal and female climacteric states: Secondary | ICD-10-CM | POA: Diagnosis not present

## 2014-01-04 DIAGNOSIS — Z0000 Encounter for general adult medical examination without abnormal findings: Secondary | ICD-10-CM | POA: Diagnosis not present

## 2014-01-04 DIAGNOSIS — Z23 Encounter for immunization: Secondary | ICD-10-CM | POA: Diagnosis not present

## 2014-01-04 DIAGNOSIS — Z01419 Encounter for gynecological examination (general) (routine) without abnormal findings: Secondary | ICD-10-CM | POA: Diagnosis not present

## 2014-01-04 DIAGNOSIS — Z124 Encounter for screening for malignant neoplasm of cervix: Secondary | ICD-10-CM

## 2014-01-04 MED ORDER — ESTRADIOL 0.1 MG/GM VA CREA: TOPICAL_CREAM | VAGINAL | Status: DC

## 2014-01-04 NOTE — Progress Notes (Signed)
Patient ID: Judith [name], female   DOB: June 17, 1946, 68 y.o.   MRN: [id number] 67 y.o. [id number] Married Caucasian Fe here for annual exam. Doing well with vaginal estrogen - now only using 1 time a week.  Has not used Vit E capsule intravaginally but plans to do so. She did well with decreasing HRT and coming off last year.  Several family members with multiple health issues and traveling a lot to [place] to care for them.  Patient's last menstrual period was 09/30/1993.          Sexually active: yes  The current method of family planning is post menopausal status.    Exercising: yes  Home exercise routine includes weight training three times per week at gym and walking everyday.. Smoker:  no  Health Maintenance: Pap:  12/23/11, WNL MMG:  09/21/13, Bi-Rads 1: negative Colonoscopy:  11/21/09, adenoma; recall in 5 years BMD:  3/13, -1.9/-1.7/-1.5 scheduled 02/2014 at [facility] TD: 2006 PCP wants her to get TDaP Shingles: 2007 Pneumovax 2004; 2013 Labs:  PCP   reports that she quit smoking about 30 years ago. She has never used smokeless tobacco. She reports that she drinks about 0.5 ounces of alcohol per week. She reports that she does not use illicit drugs.  Past Medical History  Diagnosis Date  . Carotid bruit   . GERD (gastroesophageal reflux disease)   . Symptomatic PVCs   . Arthritis   . Elevated lipids   . RBBB (right bundle branch block)   . Hypertension   . Osteoarthritis     Past Surgical History  Procedure Laterality Date  . Rotator cuff repair Right 09/2002  . Refractive surgery Left 2014  . Oophorectomy Left 1969    secondary to dermoid  . Breast biopsy Left 3/97    Current Outpatient Prescriptions  Medication Sig Dispense Refill  . aspirin 81 MG tablet Take 81 mg by mouth daily.        . calcium carbonate 200 MG capsule Take 250 mg by mouth daily.        Marland Kitchen estradiol (ESTRACE) 0.1 MG/GM vaginal cream Use 1 g vaginally twice weekly  42.5 g  3  .  latanoprost (XALATAN) 0.005 % ophthalmic solution Place 1 drop into both eyes at bedtime.        . Multiple Vitamin (MULTIVITAMIN) capsule Take 1 capsule by mouth daily.        . simvastatin (ZOCOR) 20 MG tablet Take 10 mg by mouth at bedtime.       . timolol (TIMOPTIC) 0.5 % ophthalmic solution Place into both eyes daily.       No current facility-administered medications for this visit.    Family History  Problem Relation Age of Onset  . Cancer Mother 40    throat  . Hypertension Father   . Heart disease Father 23  . Hypertension Brother     ROS:  Pertinent items are noted in HPI.  Otherwise, a comprehensive ROS was negative.  Exam:   BP 106/64  Pulse 64  Ht 5' 3.75" (1.619 m)  Wt 158 lb (71.668 kg)  BMI 27.34 kg/m2  LMP 09/30/1993 Height: 5' 3.75" (161.9 cm)  Ht Readings from Last 3 Encounters:  01/04/14 5' 3.75" (1.619 m)  12/29/12 5' 3.75" (1.619 m)  07/31/11 5\' 4"  (1.626 m)    General appearance: alert, cooperative and appears stated age Head: Normocephalic, without obvious abnormality, atraumatic Neck: no adenopathy, supple, symmetrical, trachea midline and thyroid normal  to inspection and palpation Lungs: clear to auscultation bilaterally Breasts: normal appearance, no masses or tenderness Heart: regular rate and rhythm Abdomen: soft, non-tender; no masses,  no organomegaly Extremities: extremities normal, atraumatic, no cyanosis or edema Skin: Skin color, texture, turgor normal. No rashes or lesions Lymph nodes: Cervical, supraclavicular, and axillary nodes normal. No abnormal inguinal nodes palpated Neurologic: Grossly normal   Pelvic: External genitalia:  no lesions              Urethra:  normal appearing urethra with no masses, tenderness or lesions              Bartholin's and Skene's: normal                 Vagina: normal appearing vagina with normal color and discharge, no lesions              Cervix: anteverted              Pap taken: yes Bimanual  Exam:  Uterus:  normal size, contour, position, consistency, mobility, non-tender              Adnexa: no mass, fullness, tenderness               Rectovaginal: Confirms               Anus:  normal sphincter tone, no lesions  A:  Well Woman with normal exam  Postmenopausal with HRT 1987 - 2014  Situational  depression and anxiety  History  of Bundle Branch Block, HTN  Immunization update  P:   Pap smear as per guidelines done today per request   Mammogram due 12/15  Refill Estrace vaginal cream for a year\  Counseled with risk of DVT, CVA, and cancer.  TDaP given today  Counseled on breast self exam, mammography screening, adequate intake of calcium and vitamin D, diet and exercise, Kegel's exercises return annually or prn  An After Visit Summary was printed and given to the patient.

## 2014-01-04 NOTE — Progress Notes (Signed)
Encounter reviewed by Dr. Jalaya Sarver Silva.  

## 2014-01-04 NOTE — Patient Instructions (Signed)

## 2014-01-05 LAB — IPS PAP SMEAR ONLY

## 2014-01-17 DIAGNOSIS — Z1331 Encounter for screening for depression: Secondary | ICD-10-CM | POA: Diagnosis not present

## 2014-01-17 DIAGNOSIS — K5730 Diverticulosis of large intestine without perforation or abscess without bleeding: Secondary | ICD-10-CM | POA: Diagnosis not present

## 2014-01-17 DIAGNOSIS — Z23 Encounter for immunization: Secondary | ICD-10-CM | POA: Diagnosis not present

## 2014-01-17 DIAGNOSIS — J309 Allergic rhinitis, unspecified: Secondary | ICD-10-CM | POA: Diagnosis not present

## 2014-01-17 DIAGNOSIS — R002 Palpitations: Secondary | ICD-10-CM | POA: Diagnosis not present

## 2014-01-17 DIAGNOSIS — M545 Low back pain, unspecified: Secondary | ICD-10-CM | POA: Diagnosis not present

## 2014-01-17 DIAGNOSIS — Z0000 Encounter for general adult medical examination without abnormal findings: Secondary | ICD-10-CM | POA: Diagnosis not present

## 2014-01-17 DIAGNOSIS — I6529 Occlusion and stenosis of unspecified carotid artery: Secondary | ICD-10-CM | POA: Diagnosis not present

## 2014-01-18 DIAGNOSIS — Z1212 Encounter for screening for malignant neoplasm of rectum: Secondary | ICD-10-CM | POA: Diagnosis not present

## 2014-01-21 ENCOUNTER — Ambulatory Visit (HOSPITAL_COMMUNITY)
Admission: RE | Admit: 2014-01-21 | Discharge: 2014-01-21 | Disposition: A | Discharge status: Home / Self Care | Payer: Medicare Other | Source: Ambulatory Visit | Attending: Vascular Surgery | Admitting: Vascular Surgery

## 2014-01-21 ENCOUNTER — Other Ambulatory Visit (HOSPITAL_COMMUNITY): Payer: Self-pay | Admitting: Internal Medicine

## 2014-01-21 DIAGNOSIS — I6529 Occlusion and stenosis of unspecified carotid artery: Secondary | ICD-10-CM | POA: Insufficient documentation

## 2014-03-04 DIAGNOSIS — H4011X0 Primary open-angle glaucoma, stage unspecified: Secondary | ICD-10-CM | POA: Diagnosis not present

## 2014-03-04 DIAGNOSIS — H409 Unspecified glaucoma: Secondary | ICD-10-CM | POA: Diagnosis not present

## 2014-03-08 DIAGNOSIS — M949 Disorder of cartilage, unspecified: Secondary | ICD-10-CM | POA: Diagnosis not present

## 2014-03-08 DIAGNOSIS — M899 Disorder of bone, unspecified: Secondary | ICD-10-CM | POA: Diagnosis not present

## 2014-05-07 ENCOUNTER — Observation Stay (HOSPITAL_COMMUNITY)
Admission: EM | Admit: 2014-05-07 | Discharge: 2014-05-09 | Disposition: A | Discharge status: Home / Self Care | Payer: Medicare Other | Attending: Internal Medicine | Admitting: Internal Medicine

## 2014-05-07 ENCOUNTER — Encounter (HOSPITAL_COMMUNITY): Payer: Self-pay | Admitting: Emergency Medicine

## 2014-05-07 ENCOUNTER — Emergency Department (HOSPITAL_COMMUNITY): Payer: Medicare Other

## 2014-05-07 DIAGNOSIS — R7309 Other abnormal glucose: Secondary | ICD-10-CM | POA: Diagnosis not present

## 2014-05-07 DIAGNOSIS — E782 Mixed hyperlipidemia: Secondary | ICD-10-CM

## 2014-05-07 DIAGNOSIS — Z882 Allergy status to sulfonamides status: Secondary | ICD-10-CM | POA: Diagnosis not present

## 2014-05-07 DIAGNOSIS — Z7982 Long term (current) use of aspirin: Secondary | ICD-10-CM | POA: Diagnosis not present

## 2014-05-07 DIAGNOSIS — K219 Gastro-esophageal reflux disease without esophagitis: Secondary | ICD-10-CM | POA: Diagnosis not present

## 2014-05-07 DIAGNOSIS — H409 Unspecified glaucoma: Secondary | ICD-10-CM | POA: Insufficient documentation

## 2014-05-07 DIAGNOSIS — R404 Transient alteration of awareness: Secondary | ICD-10-CM | POA: Diagnosis not present

## 2014-05-07 DIAGNOSIS — R739 Hyperglycemia, unspecified: Secondary | ICD-10-CM | POA: Diagnosis present

## 2014-05-07 DIAGNOSIS — R42 Dizziness and giddiness: Secondary | ICD-10-CM | POA: Diagnosis not present

## 2014-05-07 DIAGNOSIS — R112 Nausea with vomiting, unspecified: Secondary | ICD-10-CM

## 2014-05-07 DIAGNOSIS — H81399 Other peripheral vertigo, unspecified ear: Secondary | ICD-10-CM | POA: Diagnosis not present

## 2014-05-07 DIAGNOSIS — I10 Essential (primary) hypertension: Secondary | ICD-10-CM | POA: Diagnosis not present

## 2014-05-07 DIAGNOSIS — Z79899 Other long term (current) drug therapy: Secondary | ICD-10-CM | POA: Insufficient documentation

## 2014-05-07 DIAGNOSIS — Z880 Allergy status to penicillin: Secondary | ICD-10-CM | POA: Insufficient documentation

## 2014-05-07 LAB — CBC
HCT: 38.7 % (ref 36.0–46.0)
HEMOGLOBIN: 12.9 g/dL (ref 12.0–15.0)
MCH: 30.8 pg (ref 26.0–34.0)
MCHC: 33.3 g/dL (ref 30.0–36.0)
MCV: 92.4 fL (ref 78.0–100.0)
PLATELETS: 265 10*3/uL (ref 150–400)
RBC: 4.19 MIL/uL (ref 3.87–5.11)
RDW: 13.4 % (ref 11.5–15.5)
WBC: 7.3 10*3/uL (ref 4.0–10.5)

## 2014-05-07 LAB — CBC WITH DIFFERENTIAL/PLATELET
BASOS PCT: 1 % (ref 0–1)
Basophils Absolute: 0.1 10*3/uL (ref 0.0–0.1)
EOS ABS: 0.2 10*3/uL (ref 0.0–0.7)
Eosinophils Relative: 2 % (ref 0–5)
HCT: 41.8 % (ref 36.0–46.0)
Hemoglobin: 13.7 g/dL (ref 12.0–15.0)
Lymphocytes Relative: 19 % (ref 12–46)
Lymphs Abs: 1.8 10*3/uL (ref 0.7–4.0)
MCH: 30.5 pg (ref 26.0–34.0)
MCHC: 32.8 g/dL (ref 30.0–36.0)
MCV: 93.1 fL (ref 78.0–100.0)
Monocytes Absolute: 0.5 10*3/uL (ref 0.1–1.0)
Monocytes Relative: 5 % (ref 3–12)
NEUTROS ABS: 6.7 10*3/uL (ref 1.7–7.7)
Neutrophils Relative %: 73 % (ref 43–77)
PLATELETS: 278 10*3/uL (ref 150–400)
RBC: 4.49 MIL/uL (ref 3.87–5.11)
RDW: 13.3 % (ref 11.5–15.5)
WBC: 9.3 10*3/uL (ref 4.0–10.5)

## 2014-05-07 LAB — COMPREHENSIVE METABOLIC PANEL
ALT: 15 U/L (ref 0–35)
ANION GAP: 13 (ref 5–15)
AST: 18 U/L (ref 0–37)
Albumin: 3.8 g/dL (ref 3.5–5.2)
Alkaline Phosphatase: 53 U/L (ref 39–117)
BUN: 16 mg/dL (ref 6–23)
CALCIUM: 9.4 mg/dL (ref 8.4–10.5)
CO2: 26 mEq/L (ref 19–32)
CREATININE: 0.89 mg/dL (ref 0.50–1.10)
Chloride: 103 mEq/L (ref 96–112)
GFR calc Af Amer: 74 mL/min — ABNORMAL LOW (ref >90)
GFR, EST NON AFRICAN AMERICAN: 64 mL/min — AB (ref >90)
GLUCOSE: 122 mg/dL — AB (ref 70–99)
Potassium: 4.4 mEq/L (ref 3.7–5.3)
SODIUM: 142 meq/L (ref 137–147)
Total Bilirubin: 0.3 mg/dL (ref 0.3–1.2)
Total Protein: 7.3 g/dL (ref 6.0–8.3)

## 2014-05-07 LAB — URINALYSIS, ROUTINE W REFLEX MICROSCOPIC
Bilirubin Urine: NEGATIVE
Glucose, UA: NEGATIVE mg/dL
Hgb urine dipstick: NEGATIVE
KETONES UR: NEGATIVE mg/dL
LEUKOCYTES UA: NEGATIVE
NITRITE: NEGATIVE
PH: 7 (ref 5.0–8.0)
PROTEIN: NEGATIVE mg/dL
Specific Gravity, Urine: 1.01 (ref 1.005–1.030)
Urobilinogen, UA: 0.2 mg/dL (ref 0.0–1.0)

## 2014-05-07 LAB — VITAMIN B12: VITAMIN B 12: 1243 pg/mL — AB (ref 211–911)

## 2014-05-07 LAB — CREATININE, SERUM
CREATININE: 0.83 mg/dL (ref 0.50–1.10)
GFR calc Af Amer: 81 mL/min — ABNORMAL LOW (ref >90)
GFR, EST NON AFRICAN AMERICAN: 70 mL/min — AB (ref >90)

## 2014-05-07 LAB — HEMOGLOBIN A1C
HEMOGLOBIN A1C: 5.6 % (ref <5.7)
MEAN PLASMA GLUCOSE: 114 mg/dL (ref <117)

## 2014-05-07 LAB — TSH: TSH: 0.331 u[IU]/mL — ABNORMAL LOW (ref 0.350–4.500)

## 2014-05-07 LAB — TROPONIN I

## 2014-05-07 MED ORDER — PANTOPRAZOLE SODIUM 40 MG PO TBEC
40.0000 mg | DELAYED_RELEASE_TABLET | Freq: Every day | ORAL | Status: DC
  Administered 2014-05-07 – 2014-05-09 (×3): 40 mg via ORAL
  Filled 2014-05-07 (×3): qty 1

## 2014-05-07 MED ORDER — ASPIRIN 81 MG PO TABS: 81.0000 mg | ORAL_TABLET | Freq: Every day | ORAL | Status: DC

## 2014-05-07 MED ORDER — MULTIVITAMINS PO CAPS: 1.0000 | ORAL_CAPSULE | Freq: Every day | ORAL | Status: DC

## 2014-05-07 MED ORDER — SODIUM CHLORIDE 0.9 % IV BOLUS (SEPSIS)
500.0000 mL | Freq: Once | INTRAVENOUS | Status: AC
  Administered 2014-05-07: 500 mL via INTRAVENOUS

## 2014-05-07 MED ORDER — MECLIZINE HCL 25 MG PO TABS
25.0000 mg | ORAL_TABLET | Freq: Three times a day (TID) | ORAL | Status: DC | PRN
  Administered 2014-05-08: 25 mg via ORAL
  Filled 2014-05-07 (×2): qty 1

## 2014-05-07 MED ORDER — ASPIRIN 81 MG PO CHEW
81.0000 mg | CHEWABLE_TABLET | Freq: Every day | ORAL | Status: DC
  Administered 2014-05-07 – 2014-05-09 (×3): 81 mg via ORAL
  Filled 2014-05-07 (×3): qty 1

## 2014-05-07 MED ORDER — MECLIZINE HCL 25 MG PO TABS
50.0000 mg | ORAL_TABLET | Freq: Once | ORAL | Status: AC
  Administered 2014-05-07: 50 mg via ORAL
  Filled 2014-05-07: qty 2

## 2014-05-07 MED ORDER — LATANOPROST 0.005 % OP SOLN
1.0000 [drp] | Freq: Every day | OPHTHALMIC | Status: DC
  Administered 2014-05-07 – 2014-05-08 (×2): 1 [drp] via OPHTHALMIC
  Filled 2014-05-07: qty 2.5

## 2014-05-07 MED ORDER — ACETAMINOPHEN 325 MG PO TABS
650.0000 mg | ORAL_TABLET | Freq: Four times a day (QID) | ORAL | Status: DC | PRN
  Administered 2014-05-08: 650 mg via ORAL
  Filled 2014-05-07: qty 2

## 2014-05-07 MED ORDER — LORAZEPAM 2 MG/ML IJ SOLN
1.0000 mg | Freq: Once | INTRAMUSCULAR | Status: AC
  Administered 2014-05-07: 1 mg via INTRAVENOUS
  Filled 2014-05-07: qty 1

## 2014-05-07 MED ORDER — ONDANSETRON HCL 4 MG/2ML IJ SOLN
4.0000 mg | Freq: Once | INTRAMUSCULAR | Status: AC
  Administered 2014-05-07: 4 mg via INTRAVENOUS
  Filled 2014-05-07: qty 2

## 2014-05-07 MED ORDER — ENOXAPARIN SODIUM 40 MG/0.4ML ~~LOC~~ SOLN
40.0000 mg | SUBCUTANEOUS | Status: DC
  Administered 2014-05-07 – 2014-05-08 (×2): 40 mg via SUBCUTANEOUS
  Filled 2014-05-07 (×3): qty 0.4

## 2014-05-07 MED ORDER — SIMVASTATIN 10 MG PO TABS
10.0000 mg | ORAL_TABLET | Freq: Every day | ORAL | Status: DC
Start: 2014-05-07 — End: 2014-05-09
  Administered 2014-05-07 – 2014-05-08 (×2): 10 mg via ORAL
  Filled 2014-05-07 (×3): qty 1

## 2014-05-07 MED ORDER — SODIUM CHLORIDE 0.9 % IV SOLN
INTRAVENOUS | Status: DC
  Administered 2014-05-07 – 2014-05-09 (×3): via INTRAVENOUS

## 2014-05-07 MED ORDER — ACETAMINOPHEN 650 MG RE SUPP: 650.0000 mg | Freq: Four times a day (QID) | RECTAL | Status: DC | PRN

## 2014-05-07 MED ORDER — ONDANSETRON HCL 4 MG/2ML IJ SOLN
4.0000 mg | Freq: Four times a day (QID) | INTRAMUSCULAR | Status: DC | PRN
  Administered 2014-05-08: 4 mg via INTRAVENOUS
  Filled 2014-05-07: qty 2

## 2014-05-07 MED ORDER — TIMOLOL MALEATE 0.5 % OP SOLN
1.0000 [drp] | Freq: Every day | OPHTHALMIC | Status: DC
  Administered 2014-05-07 – 2014-05-09 (×3): 1 [drp] via OPHTHALMIC
  Filled 2014-05-07: qty 5

## 2014-05-07 MED ORDER — ONDANSETRON HCL 4 MG PO TABS: 4.0000 mg | ORAL_TABLET | Freq: Four times a day (QID) | ORAL | Status: DC | PRN

## 2014-05-07 NOTE — Progress Notes (Signed)
PT Cancellation Note  Patient Details Name: Judith [name] MRN: [id number] DOB: 07-29-1946   Cancelled Treatment:    Reason Eval/Treat Not Completed: Other (comment) (Nursing asked PT to see pt in am)   [name],Judith Groeneveld 05/07/2014, 4:30 PM [facility] [phone] (pager)

## 2014-05-07 NOTE — ED Provider Notes (Signed)
CSN: [id number]     Arrival date & time 05/07/14  0444 History   First MD Initiated Contact with Patient 05/07/14 [phone]     Chief Complaint  Patient presents with  . Dizziness     (Consider location/radiation/quality/duration/timing/severity/associated sxs/prior Treatment) HPI  Judith [name] Is a 68 year old female with a past medical history of elevated cholesterol, hypertension, right bundle branch block, carotid bruit visits the emergency department with chief complaint of dizziness. Patient states that yesterday morning she was laying on her left side when she had sudden onset of vertigo. She states it lasted approximately 5 minutes and resolved. That night her vertigo came back and seemed to progressively worsen. She states she was able to get to sleep however she had to lie flat on her back and stay very still to avoid feeling extremely nauseated.  Early this morning the patient sat up in bed to use the bathroom and fell over while sitting up in bed. She called her husband and told him that she needed to be seen at the hospital. Patient was transported via EMS. She had one episode of vomiting prior to arrival. She has no previous history of vertigo or symptoms similar to her today. She denies any unilateral weakness, difficulty with speech or swallowing, headache. She does see the room spinning around her and states it is moving clockwise. She is a 20-pack-year smoking history but quit smoking in 1984.  Past Medical History  Diagnosis Date  . Carotid bruit   . GERD (gastroesophageal reflux disease)   . Symptomatic PVCs   . Arthritis   . Elevated lipids   . RBBB (right bundle branch block)   . Hypertension   . Osteoarthritis    Past Surgical History  Procedure Laterality Date  . Rotator cuff repair Right 09/2002  . Refractive surgery Left 2014  . Oophorectomy Left 1969    secondary to dermoid  . Breast biopsy Left 3/97   Family History  Problem Relation Age of Onset  . Cancer  Mother 66    throat  . Hypertension Father   . Heart disease Father 22  . Hypertension Brother    History  Substance Use Topics  . Smoking status: Former Smoker -- 1.00 packs/day for 20 years    Quit date: 10/01/1983  . Smokeless tobacco: Never Used  . Alcohol Use: 0.5 - 1.0 oz/week    1-2 drink(s) per week     Comment: occassionally wine   OB History   Grav Para Term Preterm Abortions TAB SAB Ect Mult Living   3 2 2  1     2      Review of Systems  Ten systems reviewed and are negative for acute change, except as noted in the HPI.    Allergies  Erythromycin; Penicillins; and Sulfa antibiotics  Home Medications   Prior to Admission medications   Medication Sig Start Date End Date Taking? Authorizing Provider  aspirin 81 MG tablet Take 81 mg by mouth daily.     Yes Historical Provider, MD  calcium carbonate 200 MG capsule Take 250 mg by mouth daily.     Yes Historical Provider, MD  estradiol (ESTRACE) 0.1 MG/GM vaginal cream Use 1 g vaginally twice weekly 01/04/14  Yes Patricia Rolen-Grubb, FNP  latanoprost (XALATAN) 0.005 % ophthalmic solution Place 1 drop into both eyes at bedtime.     Yes Historical Provider, MD  Multiple Vitamin (MULTIVITAMIN) capsule Take 1 capsule by mouth daily.     Yes  Historical Provider, MD  simvastatin (ZOCOR) 20 MG tablet Take 10 mg by mouth at bedtime.    Yes Historical Provider, MD  timolol (TIMOPTIC) 0.5 % ophthalmic solution Place 1 drop into both eyes daily.  12/21/13  Yes Historical Provider, MD   BP 175/90  Pulse 74  Temp(Src) 97.9 F (36.6 C) (Oral)  Resp 15  Wt 156 lb (70.761 kg)  SpO2 98%  LMP 09/30/1993 Physical Exam Physical Exam  Nursing note and vitals reviewed. Constitutional: She is oriented to person, place, and time. She appears well-developed and well-nourished. No distress.  HENT:  Head: Normocephalic and atraumatic.  Eyes: Conjunctivae normal and EOM are normal. Pupils are equal, round, and reactive to light. No  scleral icterus.  Neck: Normal range of motion.  Cardiovascular: Normal rate, regular rhythm and normal heart sounds.  Exam reveals no gallop and no friction rub.   No murmur heard. Pulmonary/Chest: Effort normal and breath sounds normal. No respiratory distress.  Abdominal: Soft. Bowel sounds are normal. She exhibits no distension and no mass. There is no tenderness. There is no guarding.  Neurological: She is alert and oriented to person, place, and time. Speech is clear and goal oriented, follows commands Major Cranial nerves without deficit, no facial droop Normal strength in upper and lower extremities bilaterally including dorsiflexion and plantar flexion, strong and equal grip strength Sensation normal to light and sharp touch Moves extremities without ataxia, coordination intact Normal finger to nose and rapid alternating movements no pronator drift Dystaxic gait. Unable to stand without falling. Skin: Skin is warm and dry. She is not diaphoretic.    ED Course  Procedures (including critical care time) Labs Review Labs Reviewed  COMPREHENSIVE METABOLIC PANEL - Abnormal; Notable for the following:    Glucose, Bld 122 (*)    GFR calc non Af Amer 64 (*)    GFR calc Af Amer 74 (*)    All other components within normal limits  CREATININE, SERUM - Abnormal; Notable for the following:    GFR calc non Af Amer 70 (*)    GFR calc Af Amer 81 (*)    All other components within normal limits  TSH - Abnormal; Notable for the following:    TSH 0.331 (*)    All other components within normal limits  VITAMIN B12 - Abnormal; Notable for the following:    Vitamin B-12 1243 (*)    All other components within normal limits  BASIC METABOLIC PANEL - Abnormal; Notable for the following:    GFR calc non Af Amer 73 (*)    GFR calc Af Amer 85 (*)    All other components within normal limits  CBC WITH DIFFERENTIAL  TROPONIN I  URINALYSIS, ROUTINE W REFLEX MICROSCOPIC  HEMOGLOBIN A1C  CBC   CBC  T4, FREE  VITAMIN D 1,25 DIHYDROXY    Imaging Review No results found.   EKG Interpretation   Date/Time:  Saturday May 07 2014 05:21:11 EDT Ventricular Rate:  65 PR Interval:  166 QRS Duration: 136 QT Interval:  435 QTC Calculation: 452 R Axis:   71 Text Interpretation:  Age not entered, assumed to be  68 years old for  purpose of ECG interpretation Sinus rhythm LAE, consider biatrial  enlargement Right bundle branch block Confirmed by YELVERTON  MD, DAVID  ([id number]) on 05/07/2014 6:56:59 AM      MDM   Final diagnoses:  None   7:44 AM BP 175/90  Pulse 74  Temp(Src) 97.9 F (36.6  C) (Oral)  Resp 15  Wt 156 lb (70.761 kg)  SpO2 98%  LMP 09/30/1993 patient with hypertension. She has no focal abnormalities on her neurologic exam except for the dystaxia. I did not note any nice segments. The patient likely has a peripheral origin of her vertigo today however given that she is 68 years old and has some risk factors we will use an MRI to rule out stroke. EKG unchanged from previous.  ABCD2 score of 4 making her moderate risk.   Patient MRI negative for acute abnormality. She has been given antivert and 2 doses of ativan. Her nausea is resolved, however she cannot ambulate due to continued ataxia. I have spoken with Dr. Dyann Kief who will admit the patient.   I personally reviewed the imaging tests through PACS system. I have reviewed and interpreted Lab values. I reviewed available ER/hospitalization records through the [place], [place] 05/10/14 1746

## 2014-05-07 NOTE — ED Provider Notes (Signed)
Patient presents with acute onset vertigo. She states that yesterday morning she woke with the sensation of the room spinning. He was worse when she turned her head. This lasted for a few hours and then resolved completely. She then had onset of the same symptoms starting last evening around 9 or 10 PM. She woke at 4 AM with vertigo. It is associated with nausea and an episode of vomiting. She denies any focal weakness or numbness. She has no slurred speech. Denied any hearing changes or tinnitus. She's had no fever or chills. Sensation it had some rhinorrhea a few days ago that has resolved. Physical exam: Bilateral TMs clear. Patient with a fatigable rotary nystagmus. Exacerbated with turning of the head. Finger to nose bilaterally intact. 5/5 motor in all extremities. Sensation is fully intact. Heart regular rhythm. Lungs clear bilaterally. Abdomen soft nontender. No lower extremity swelling or pain. This is vertigo is likely peripheral in origin. However given age, history of hypertension and hyperlipidemia will need MRI to rule out posterior fossa stroke.  Medical screening examination/treatment/procedure(s) were conducted as a shared visit with non-physician practitioner(s) and myself.  I personally evaluated the patient during the encounter.   EKG Interpretation   Date/Time:  Saturday May 07 2014 05:21:11 EDT Ventricular Rate:  65 PR Interval:  166 QRS Duration: 136 QT Interval:  435 QTC Calculation: 452 R Axis:   71 Text Interpretation:  Age not entered, assumed to be  68 years old for  purpose of ECG interpretation Sinus rhythm LAE, consider biatrial  enlargement Right bundle branch block Confirmed by Lita Mains  MD, Merion Caton  ([id number]) on 05/07/2014 6:56:59 AM        Julianne Rice, MD 05/07/14 [phone]

## 2014-05-07 NOTE — ED Notes (Signed)
Dr. Lita Mains at [facility] speaking with pt. Pt remains calm, NAD, alert and still dizzy ("even when lying still").

## 2014-05-07 NOTE — ED Notes (Addendum)
Tolerated and passed swallow screen. Updated. Denies pain. Denies questions. Pending MRI.

## 2014-05-07 NOTE — ED Notes (Signed)
Pt returned to exam room from MRI. Denies pain. Remains alert and oriented x4. Vital signs stable. Husband at bedside.

## 2014-05-07 NOTE — ED Notes (Signed)
Pt attempted to ambulate with assistance, pt became very dizzy with standing. Able to take a few steps in room, but needed to sit down. Vital signs stable. Denies pain. Pt remains alert and oriented x4.

## 2014-05-07 NOTE — ED Notes (Signed)
Pt transported to MRI 

## 2014-05-07 NOTE — ED Notes (Signed)
Nurse unable to take report at the time, nurse to call back.

## 2014-05-07 NOTE — H&P (Signed)
[facility] History and Physical  Windsor C Pizzuto PFX:[id number] DOB: 1946/07/26 DOA: 05/07/2014  Referring physician: Dr. Reather Converse PCP: Jerlyn Ly, MD   Chief Complaint: dizzines  HPI: Judith [name] is a 68 y.o. female with a past medical history significant for hypertension (control with low sodium diet at home), hyperlipidemia, glaucoma, GERD and right bundle branch block; presented to the emergency department secondary to dizziness. Patient reports that approximately one day prior to admission in the morning she experienced dizziness episode when sitting up prior to get out of bed; at that time symptoms lasted for about 5 minutes and then went away. Dizziness/vertigo symptoms reappears at night and seemed to progressively worsen and been more intense. On the morning of admission at the moment that she woke up she was really unable to get out of bed due to the fear that she is in a fall and fell that the room was spinning about her. Patient denies any difficulty talking or expressing herself, denies any focal motor deficit all her limbs. Patient also denies chest pain, shortness of breath, vomiting, abdominal pain, dysuria, hematochezia, melena or any other acute complaints. In the ED MRI demonstrated no acute intracranial abnormalities and due to persistency of vertigo symptoms [facility] has been called to admit patient for further evaluation and treatment. Of note patient endorses nausea associated with dizziness and/or spinning sensation.    Review of Systems:  Negative except as otherwise mentioned on HPI.  Past Medical History  Diagnosis Date  . Carotid bruit   . GERD (gastroesophageal reflux disease)   . Symptomatic PVCs   . Arthritis   . Elevated lipids   . RBBB (right bundle branch block)   . Hypertension   . Osteoarthritis    Past Surgical History  Procedure Laterality Date  . Rotator cuff repair Right 09/2002  . Refractive surgery Left 2014  . Oophorectomy Left 1969   secondary to dermoid  . Breast biopsy Left 3/97   Social History:  reports that she quit smoking about 30 years ago. She has never used smokeless tobacco. She reports that she drinks about .5 - 1 ounces of alcohol per week. She reports that she does not use illicit drugs.  Allergies  Allergen Reactions  . Erythromycin Other (See Comments)    GI upset  . Penicillins Rash  . Sulfa Antibiotics Rash    Family History  Problem Relation Age of Onset  . Cancer Mother 17    throat  . Hypertension Father   . Heart disease Father 70  . Hypertension Brother      Prior to Admission medications   Medication Sig Start Date End Date Taking? Authorizing Provider  aspirin 81 MG tablet Take 81 mg by mouth daily.     Yes Historical Provider, MD  calcium carbonate 200 MG capsule Take 250 mg by mouth daily.     Yes Historical Provider, MD  estradiol (ESTRACE) 0.1 MG/GM vaginal cream Use 1 g vaginally twice weekly 01/04/14  Yes Patricia Rolen-Grubb, FNP  latanoprost (XALATAN) 0.005 % ophthalmic solution Place 1 drop into both eyes at bedtime.     Yes Historical Provider, MD  Multiple Vitamin (MULTIVITAMIN) capsule Take 1 capsule by mouth daily.     Yes Historical Provider, MD  simvastatin (ZOCOR) 20 MG tablet Take 10 mg by mouth at bedtime.    Yes Historical Provider, MD  timolol (TIMOPTIC) 0.5 % ophthalmic solution Place 1 drop into both eyes daily.  12/21/13  Yes Historical Provider, MD  Physical Exam: Filed Vitals:   05/07/14 0922 05/07/14 0938 05/07/14 1110 05/07/14 1237  BP: 161/63 147/58 122/93 130/73  Pulse: 88 81 84 85  Temp:      TempSrc:      Resp:  18    Weight:      SpO2:  98% 97% 97%    Wt Readings from Last 3 Encounters:  05/07/14 70.761 kg (156 lb)  01/04/14 71.668 kg (158 lb)  12/29/12 66.679 kg (147 lb)    General:  Appears calm; reports feeling better but is still significantly dizzy with minimal movements. Afebrile Eyes: PERRL, normal lids, irises & conjunctiva; no  icterus; positive nystagmus, with  evaluation of eyes movements and moving her head side to side ENT: grossly normal hearing, lips & tongue; no erythema, thrush, exudates; no drainage of her ears or nostrils Neck: no LAD, masses or thyromegaly; no JVD Cardiovascular: RRR, no m/r/g. No LE edema. Respiratory: CTA bilaterally, no w/r/r. Normal respiratory effort. Abdomen: soft, nt, nd Skin: no rash or induration seen on limited exam Musculoskeletal: grossly normal tone BUE/BLE Psychiatric: grossly normal mood and affect, speech fluent and appropriate Neurologic: grossly non-focal.          Labs on Admission:  Basic Metabolic Panel:  Recent Labs Lab 05/07/14 0512  NA 142  K 4.4  CL 103  CO2 26  GLUCOSE 122*  BUN 16  CREATININE 0.89  CALCIUM 9.4   Liver Function Tests:  Recent Labs Lab 05/07/14 0512  AST 18  ALT 15  ALKPHOS 53  BILITOT 0.3  PROT 7.3  ALBUMIN 3.8   CBC:  Recent Labs Lab 05/07/14 0512  WBC 9.3  NEUTROABS 6.7  HGB 13.7  HCT 41.8  MCV 93.1  PLT 278   Cardiac Enzymes:  Recent Labs Lab 05/07/14 0512  TROPONINI <0.30    Radiological Exams on Admission: Mr Brain Wo Contrast  05/07/2014   CLINICAL DATA:  68 year old female with acute onset dizziness and vertigo. Initial encounter.  EXAM: MRI HEAD WITHOUT CONTRAST  TECHNIQUE: Multiplanar, multiecho pulse sequences of the brain and surrounding structures were obtained without intravenous contrast.  COMPARISON:  Cerebral volume is normal. No restricted diffusion to suggest acute infarction. No midline shift, mass effect, evidence of mass lesion, ventriculomegaly, extra-axial collection or acute intracranial hemorrhage. Cervicomedullary junction and pituitary are within normal limits. Major intracranial vascular flow voids are preserved. Asymmetrically tortuous left ICA cavernous segment.  Visible internal auditory structures appear normal. Mild for age scattered cerebral white matter T2 and FLAIR  hyperintensity. The configuration is nonspecific. No cortical encephalomalacia. Deep gray matter nuclei, brainstem, and cerebellum are within normal limits.  Negative visualized cervical spine. Normal bone marrow signal. Visualized orbit soft tissues are within normal limits. Visualized paranasal sinuses and mastoids are clear. Visualized scalp soft tissues are within normal limits.  FINDINGS: 1.  No acute intracranial abnormality. 2. Mild for age nonspecific cerebral white matter signal changes.   Electronically Signed   By: Lars Pinks M.D.   On: 05/07/2014 09:52    EKG:  Ventricular Rate: 65  PR Interval: 166  QRS Duration: 136  QT Interval: 435  QTC Calculation: 452  R Axis: 71 Interpretation: Sinus rhythm, no acute ischemic changes appreciated; right bundle branch block once again appreciated   Assessment/Plan 1-Dizziness: secondary to peripheral Vertigo -Mild nystagmus appreciated on physical exam when assessing eyes movements -Will admit to MedSurg bed -Patient has had MRI of her brain without contrast negative for acute stroke -Blister as  needed meclizine for dizziness -When necessary antiemetics -PT/OT vestibular evaluation -will check TSH, B12 and Vit D level -will follow clinical response  2-HTN (hypertension): stable and controlled. -continue heart healthy diet -was not taking any antihypertensive regimen prirot o admission  3-Mixed hyperlipidemia: continue statins  4-Hyperglycemia: will check A1C. No hx of diabetes. -repeat fasting CBG's in am  5-GERD (gastroesophageal reflux disease): will use protonix  6-Glaucoma: continue timolol and xalatan  Code Status: Full DVT Prophylaxis:lovenox Family Communication: husband at bedside Disposition Plan: Observation, MedSurg bed; LOS less than to midnight  Time spent: 71 minutes  Barton Dubois [facility] Pager [phone]  **Disclaimer: This note may have been dictated with voice recognition software. Similar  sounding words can inadvertently be transcribed and this note may contain transcription errors which may not have been corrected upon publication of note.**

## 2014-05-07 NOTE — ED Notes (Signed)
Pt here from home by [facility] co. EMS, developed dizziness yesterday which subsided, returned last night around 2200, gradually progressively worse, denies h/o same, also reports nv. (denies: CP other pain, HA, sob, fever, diarrhea). NSL, 20g L AC, started by EMS and zofran given, nausea lessened. Dizziness remains. CBG 117 PTA.   NSR with RBBB on monitor. VSS.  A&Ox4, speech clear, no dyspnea.

## 2014-05-08 DIAGNOSIS — K219 Gastro-esophageal reflux disease without esophagitis: Secondary | ICD-10-CM | POA: Diagnosis not present

## 2014-05-08 DIAGNOSIS — I10 Essential (primary) hypertension: Secondary | ICD-10-CM | POA: Diagnosis not present

## 2014-05-08 DIAGNOSIS — R112 Nausea with vomiting, unspecified: Secondary | ICD-10-CM

## 2014-05-08 DIAGNOSIS — H81399 Other peripheral vertigo, unspecified ear: Secondary | ICD-10-CM | POA: Diagnosis not present

## 2014-05-08 DIAGNOSIS — R42 Dizziness and giddiness: Secondary | ICD-10-CM | POA: Diagnosis not present

## 2014-05-08 DIAGNOSIS — H409 Unspecified glaucoma: Secondary | ICD-10-CM | POA: Diagnosis not present

## 2014-05-08 LAB — CBC
HEMATOCRIT: 37.5 % (ref 36.0–46.0)
HEMOGLOBIN: 12.4 g/dL (ref 12.0–15.0)
MCH: 31 pg (ref 26.0–34.0)
MCHC: 33.1 g/dL (ref 30.0–36.0)
MCV: 93.8 fL (ref 78.0–100.0)
Platelets: 257 10*3/uL (ref 150–400)
RBC: 4 MIL/uL (ref 3.87–5.11)
RDW: 13.5 % (ref 11.5–15.5)
WBC: 6.9 10*3/uL (ref 4.0–10.5)

## 2014-05-08 LAB — BASIC METABOLIC PANEL
Anion gap: 10 (ref 5–15)
BUN: 14 mg/dL (ref 6–23)
CO2: 24 mEq/L (ref 19–32)
CREATININE: 0.8 mg/dL (ref 0.50–1.10)
Calcium: 8.6 mg/dL (ref 8.4–10.5)
Chloride: 106 mEq/L (ref 96–112)
GFR calc Af Amer: 85 mL/min — ABNORMAL LOW (ref >90)
GFR calc non Af Amer: 73 mL/min — ABNORMAL LOW (ref >90)
GLUCOSE: 98 mg/dL (ref 70–99)
POTASSIUM: 4.2 meq/L (ref 3.7–5.3)
Sodium: 140 mEq/L (ref 137–147)

## 2014-05-08 MED ORDER — MECLIZINE HCL 25 MG PO TABS
25.0000 mg | ORAL_TABLET | Freq: Two times a day (BID) | ORAL | Status: DC
  Administered 2014-05-08 – 2014-05-09 (×2): 25 mg via ORAL
  Filled 2014-05-08 (×3): qty 1

## 2014-05-08 MED ORDER — LORAZEPAM 2 MG/ML IJ SOLN: 0.5000 mg | Freq: Three times a day (TID) | INTRAMUSCULAR | Status: DC | PRN

## 2014-05-08 NOTE — Evaluation (Signed)
Physical Therapy Evaluation Patient Details Name: Judith [name] MRN: [id number] DOB: Nov 14, 1945 Today's Date: 05/08/2014   History of Present Illness    Judith [name] is a 68 y.o. female with a past medical history significant for hypertension (control with low sodium diet at home), hyperlipidemia, glaucoma, GERD and right bundle branch block; presented to the emergency department secondary to dizziness. Patient reports that approximately one day prior to admission in the morning she experienced dizziness episode when sitting up prior to get out of bed; at that time symptoms lasted for about 5 minutes and then went away. Dizziness/vertigo symptoms reappears at night and seemed to progressively worsen and been more intense. On the morning of admission at the moment that she woke up she was really unable to get out of bed due to the fear that she is in a fall and fell that the room was spinning about her. Patient denies any difficulty talking or expressing herself, denies any focal motor deficit all her limbs. Patient also denies chest pain, shortness of breath, vomiting, abdominal pain, dysuria, hematochezia, melena or any other acute complaints. In the ED MRI demonstrated no acute intracranial abnormalities and due to persistency of vertigo symptoms [facility] has been called to admit patient for further evaluation and treatment.  Of note patient endorses nausea associated with dizziness and/or spinning sensation.   Clinical Impression  Pt admitted with vertigo. Pt currently with functional limitations due to the deficits listed below (see PT Problem List).  Pt will benefit from skilled PT to increase their independence and safety with mobility to allow discharge to the venue listed below.     Follow Up Recommendations Outpatient PT;Supervision/Assistance - 24 hour (for vestibular rehab)    Equipment Recommendations  Rolling walker with 5" wheels    Recommendations for Other Services       Precautions  / Restrictions Precautions Precautions: Fall Restrictions Weight Bearing Restrictions: No      Mobility  Bed Mobility Overal bed mobility: Needs Assistance Bed Mobility: Supine to Sit     Supine to sit: Min assist;Mod assist     General bed mobility comments: needed min to mod asssit with vertigo present as pt unsteady balance.  Transfers Overall transfer level: Needs assistance Equipment used: Rolling walker (2 wheeled) Transfers: Sit to/from Stand Sit to Stand: Min assist         General transfer comment: cues for hand placement  Ambulation/Gait                Stairs            Wheelchair Mobility    Modified Rankin (Stroke Patients Only)       Balance Overall balance assessment: Needs assistance Sitting-balance support: Bilateral upper extremity supported;Feet supported Sitting balance-Leahy Scale: Poor Sitting balance - Comments: Needed UE support secondary to dizziness                                     Pertinent Vitals/Pain Pain Assessment: No/denies pain    Home Living Family/patient expects to be discharged to:: Private residence Living Arrangements: Spouse/significant other Available Help at Discharge: Family;Available 24 hours/day Type of Home: House Home Access: Stairs to enter   [employer] of Steps: 3 Home Layout: One level Home Equipment: None      Prior Function Level of Independence: Independent  Hand Dominance        Extremity/Trunk Assessment   Upper Extremity Assessment: Defer to OT evaluation           Lower Extremity Assessment: Overall WFL for tasks assessed      Cervical / Trunk Assessment: Normal  Communication   Communication: No difficulties  Cognition Arousal/Alertness: Awake/alert Behavior During Therapy: WFL for tasks assessed/performed Overall Cognitive Status: Within Functional Limits for tasks assessed                       General Comments General comments (skin integrity, edema, etc.): Vestibular assessment:  Supine head roll negative bil.  Left Hallpike Dix positive.  Performed Canalith repositioning maneuver on pt and when completed pt vomiting alot.  Got pt settled in bed with cool clothes on forehead to ease nausea.  Called nursing to bring nausea med and instructed pt PT would be back to reassess in 1 hour at 1230.      Exercises        Assessment/Plan    PT Assessment Patient needs continued PT services  PT Diagnosis  (dizziness)   PT Problem List Decreased activity tolerance;Decreased balance;Decreased mobility;Decreased knowledge of use of DME;Decreased safety awareness;Decreased knowledge of precautions (dizziness)  PT Treatment Interventions DME instruction;Gait training;Functional mobility training;Therapeutic activities;Therapeutic exercise;Balance training;Patient/family education   PT Goals (Current goals can be found in the Care Plan section) Acute Rehab PT Goals Patient Stated Goal: to go home PT Goal Formulation: With patient Time For Goal Achievement: 05/15/14 Potential to Achieve Goals: Good    Frequency Min 3X/week   Barriers to discharge        Co-evaluation               End of Session Equipment Utilized During Treatment: Gait belt Activity Tolerance: Patient limited by fatigue (limited by dizziness) Patient left: in bed;with call bell/phone within reach Nurse Communication: Mobility status;Other (comment) (pt requests nausea meds)    Functional Assessment Tool Used: clinical judgment Functional Limitation: Mobility: Walking and moving around Mobility: Walking and Moving Around Current Status [phone]): At least 20 percent but less than 40 percent impaired, limited or restricted Mobility: Walking and Moving Around Goal Status [phone]): At least 1 percent but less than 20 percent impaired, limited or restricted    Time: 9373-4287 PT Time Calculation (min): 37  min   Charges:   PT Evaluation $Initial PT Evaluation Tier I: 1 Procedure PT Treatments $Therapeutic Activity: 8-22 mins $Canalith Rep Proc: 8-22 mins   PT G Codes:   Functional Assessment Tool Used: clinical judgment Functional Limitation: Mobility: Walking and moving around    INGOLD,Winry Egnew 05/08/2014, 2:11 PM [facility] [phone] (pager)

## 2014-05-08 NOTE — Progress Notes (Signed)
Pt has blood pressure of 171/88 mm of hg and  c/o headache . Lamar Blinks, NP notified.  Will cont to monitor.

## 2014-05-08 NOTE — Progress Notes (Signed)
[facility] PROGRESS NOTE  Anwar C Kolker WUJ:[id number] DOB: [birth year] DOA: 05/07/2014 PCP: Jerlyn Ly, MD  Assessment/Plan: 1-Dizziness: secondary to peripheral Vertigo  -positive vestibular abnormalities as seen by PT/OT during evaluation. -Patient has had MRI of her brain without contrast negative for acute stroke  -will change meclizine to schedule twice a day regimen  -PRN antiemetics and use of ativan for intractable nausea -continue PT/OT vestibular evaluation/rehab -B12 was ok -Vit D pending -TSH slightly low; will check Free T4 (most likely sick thyroid)  -will follow clinical response   2-HTN (hypertension): stable and controlled.  -continue heart healthy diet  -was not taking any antihypertensive regimen prirot o admission  -follow up with PCP to reassess and start antihypertensive therapy if needed  3-Mixed hyperlipidemia: continue statins   4-Hyperglycemia: A1C 5.6; No prior hx of diabetes.  -repeated fasting CBG (98)   5-GERD (gastroesophageal reflux disease): will use protonix   6-Glaucoma: continue timolol and xalatan   Code Status: Full Family Communication: husband at bedside Disposition Plan: home when balance is improved, no active vomiting and safe transferring positions. Will reassess in amd and have PT to reevaluate her for safety discharge plans.   Consultants:  None   Procedures:  See below for x-ray reports   Antibiotics:  None   HPI/Subjective: Feeling better than prior to admission; but still dizzy, off balance; with difficulty transferring from one position to another due to spinning sensation and actively with nausea/vomiting.  Objective: Filed Vitals:   05/08/14 1333  BP: 161/80  Pulse: 73  Temp: 97.8 F (36.6 C)  Resp: 18    Intake/Output Summary (Last 24 hours) at 05/08/14 1346 Last data filed at 05/08/14 0830  Gross per 24 hour  Intake 1562.5 ml  Output      0 ml  Net 1562.5 ml   Filed Weights   05/07/14  0455 05/07/14 1455  Weight: 70.761 kg (156 lb) 71.867 kg (158 lb 7 oz)    Exam:   General:  Patient is still dizzy when changing positions and actively experiencing nausea/vomiting; balance is off and having difficulty transferring safely with use of walker  Cardiovascular: S1 and S2, no rubs or gallops  Respiratory: CTA bilaterally   Abdomen: soft, NT, ND, positive BS  Musculoskeletal: no edema or cyanosis  Data Reviewed: Basic Metabolic Panel:  Recent Labs Lab 05/07/14 0512 05/07/14 1505 05/08/14 0450  NA 142  --  140  K 4.4  --  4.2  CL 103  --  106  CO2 26  --  24  GLUCOSE 122*  --  98  BUN 16  --  14  CREATININE 0.89 0.83 0.80  CALCIUM 9.4  --  8.6   Liver Function Tests:  Recent Labs Lab 05/07/14 0512  AST 18  ALT 15  ALKPHOS 53  BILITOT 0.3  PROT 7.3  ALBUMIN 3.8   CBC:  Recent Labs Lab 05/07/14 0512 05/07/14 1505 05/08/14 0450  WBC 9.3 7.3 6.9  NEUTROABS 6.7  --   --   HGB 13.7 12.9 12.4  HCT 41.8 38.7 37.5  MCV 93.1 92.4 93.8  PLT 278 265 257   Cardiac Enzymes:  Recent Labs Lab 05/07/14 0512  TROPONINI <0.30     Studies: Mr Brain Wo Contrast  05/07/2014   CLINICAL DATA:  68 year old female with acute onset dizziness and vertigo. Initial encounter.  EXAM: MRI HEAD WITHOUT CONTRAST  TECHNIQUE: Multiplanar, multiecho pulse sequences of the brain and surrounding structures were obtained without  intravenous contrast.  COMPARISON:  Cerebral volume is normal. No restricted diffusion to suggest acute infarction. No midline shift, mass effect, evidence of mass lesion, ventriculomegaly, extra-axial collection or acute intracranial hemorrhage. Cervicomedullary junction and pituitary are within normal limits. Major intracranial vascular flow voids are preserved. Asymmetrically tortuous left ICA cavernous segment.  Visible internal auditory structures appear normal. Mild for age scattered cerebral white matter T2 and FLAIR hyperintensity. The  configuration is nonspecific. No cortical encephalomalacia. Deep gray matter nuclei, brainstem, and cerebellum are within normal limits.  Negative visualized cervical spine. Normal bone marrow signal. Visualized orbit soft tissues are within normal limits. Visualized paranasal sinuses and mastoids are clear. Visualized scalp soft tissues are within normal limits.  FINDINGS: 1.  No acute intracranial abnormality. 2. Mild for age nonspecific cerebral white matter signal changes.   Electronically Signed   By: Lars Pinks M.D.   On: 05/07/2014 09:52    Scheduled Meds: . aspirin  81 mg Oral Daily  . enoxaparin (LOVENOX) injection  40 mg Subcutaneous Q24H  . latanoprost  1 drop Both Eyes QHS  . pantoprazole  40 mg Oral Q1200  . simvastatin  10 mg Oral QHS  . timolol  1 drop Both Eyes Daily   Continuous Infusions: . sodium chloride 75 mL/hr at 05/08/14 0518    Principal Problem:   Vertigo Active Problems:   HTN (hypertension)   Mixed hyperlipidemia   Hyperglycemia   GERD (gastroesophageal reflux disease)   Glaucoma    Time spent: >30 minutes   Barton Dubois  [facility] Pager [phone]. If 7PM-7AM, please contact night-coverage at [email or web address], password [facility] 05/08/2014, 1:46 PM  LOS: 1 day

## 2014-05-08 NOTE — Progress Notes (Signed)
UR Completed.  Vergie Living [id number] 05/08/2014

## 2014-05-08 NOTE — Progress Notes (Signed)
Physical Therapy Treatment Patient Details Name: Judith [name] MRN: [id number] DOB: August 21, 1946 Today's Date: 05/08/2014    History of Present Illness   Judith [name] is a 68 y.o. female with a past medical history significant for hypertension (control with low sodium diet at home), hyperlipidemia, glaucoma, GERD and right bundle branch block; presented to the emergency department secondary to dizziness. Patient reports that approximately one day prior to admission in the morning she experienced dizziness episode when sitting up prior to get out of bed; at that time symptoms lasted for about 5 minutes and then went away. Dizziness/vertigo symptoms reappears at night and seemed to progressively worsen and been more intense. On the morning of admission at the moment that she woke up she was really unable to get out of bed due to the fear that she is in a fall and fell that the room was spinning about her. Patient denies any difficulty talking or expressing herself, denies any focal motor deficit all her limbs. Patient also denies chest pain, shortness of breath, vomiting, abdominal pain, dysuria, hematochezia, melena or any other acute complaints. In the ED MRI demonstrated no acute intracranial abnormalities and due to persistency of vertigo symptoms [facility] has been called to admit patient for further evaluation and treatment.  Of note patient endorses nausea associated with dizziness and/or spinning sensation.     PT Comments    Pt admitted with vertigo. Pt currently with functional limitations due to significant dizziness hindering pts ability to ambulate and participate in further vestibular testing and treatment.  Dr. Dyann Kief in agreement to give pt Meclizine and PT to return in am to further assess and treat prn.  Pt will benefit from skilled PT to increase their independence and safety with mobility to allow discharge to the venue listed below.   Follow Up Recommendations  Outpatient  PT;Supervision/Assistance - 24 hour (for vestibular rehab)     Equipment Recommendations  Rolling walker with 5" wheels    Recommendations for Other Services       Precautions / Restrictions Precautions Precautions: Fall Restrictions Weight Bearing Restrictions: No    Mobility  Bed Mobility Overal bed mobility: Needs Assistance Bed Mobility: Supine to Sit     Supine to sit: Min assist;Mod assist     General bed mobility comments: needed min to mod asssit with vertigo present as pt unsteady balance.  Once pt on EOB, nursing came in to bring nausea medicine that PT asked for upon departure earlier.  Nursing gave pt IV nausea med.    Transfers Overall transfer level: Needs assistance Equipment used: Rolling walker (2 wheeled) Transfers: Sit to/from Stand Sit to Stand: Min assist;Mod assist;+2 physical assistance         General transfer comment: cues for hand placement.  Upon standing up initially, pt abruptly looked like she was going to vomit and began vomiting.  Got cool clothes to neck and forehead.  Pt sat a bit and felt she could try and ambulate.  Ambulated with RW around bed about 15 feet with very unsteady gait needing mod assist for stability.    Ambulation/Gait Ambulation/Gait assistance: Min assist;Mod assist;+2 physical assistance Ambulation Distance (Feet): 15 Feet Assistive device: Rolling walker (2 wheeled) Gait Pattern/deviations: Step-to pattern;Decreased stride length;Staggering left;Staggering right   Gait velocity interpretation: Below normal speed for age/gender General Gait Details: Pt with unsteady gait as she ambulated with RW.  Pt still feeling nauseous and does not appear to be "over" the treatment this am.  Stairs            Wheelchair Mobility    Modified Rankin (Stroke Patients Only)       Balance Overall balance assessment: Needs assistance Sitting-balance support: Bilateral upper extremity supported;Feet supported Sitting  balance-Leahy Scale: Poor Sitting balance - Comments: Needed UE support secondary to dizziness   Standing balance support: Bilateral upper extremity supported;During functional activity Standing balance-Leahy Scale: Poor Standing balance comment: Needs UE support for static stance due to dizziness.               High level balance activites: Direction changes;Head turns;Sudden stops High Level Balance Comments: mod assist with high level activities.    Cognition Arousal/Alertness: Awake/alert Behavior During Therapy: WFL for tasks assessed/performed Overall Cognitive Status: Within Functional Limits for tasks assessed                      Exercises      General Comments General comments (skin integrity, edema, etc.): Unable to perform any further vestibular testing due to pt nausea and vomiting.  Dr. Dyann Kief aware of how sick pt is currently and agrees that pt needs Meclizine and for PT to return in am.        Pertinent Vitals/Pain Pain Assessment: No/denies pain    Home Living Family/patient expects to be discharged to:: Private residence Living Arrangements: Spouse/significant other Available Help at Discharge: Family;Available 24 hours/day Type of Home: House Home Access: Stairs to enter   Home Layout: One level Home Equipment: None      Prior Function Level of Independence: Independent          PT Goals (current goals can now be found in the care plan section) Acute Rehab PT Goals Patient Stated Goal: to go home PT Goal Formulation: With patient Time For Goal Achievement: 05/15/14 Potential to Achieve Goals: Good Additional Goals Additional Goal #1: Pt to have negative Hallpike Dix bil.   Progress towards PT goals: Not progressing toward goals - comment (vomiting and too sick to complete further assessment. )    Frequency  Min 3X/week    PT Plan Current plan remains appropriate    Co-evaluation             End of Session Equipment  Utilized During Treatment: Gait belt Activity Tolerance: Patient limited by fatigue (limited by dizziness) Patient left: in bed;with call bell/phone within reach     Time: 1238-1312 PT Time Calculation (min): 34 min  Charges:  $Gait Training: 8-22 mins $Therapeutic Activity: 8-22 mins $Canalith Rep Proc: 8-22 mins                    G Codes:  Functional Assessment Tool Used: clinical judgment Functional Limitation: Mobility: Walking and moving around Mobility: Walking and Moving Around Current Status [phone]): At least 20 percent but less than 40 percent impaired, limited or restricted Mobility: Walking and Moving Around Goal Status [phone]): At least 1 percent but less than 20 percent impaired, limited or restricted   INGOLD,Heavin Sebree 05/08/2014, 2:24 PM [facility] [phone] (pager)

## 2014-05-09 DIAGNOSIS — R42 Dizziness and giddiness: Secondary | ICD-10-CM | POA: Diagnosis not present

## 2014-05-09 DIAGNOSIS — K219 Gastro-esophageal reflux disease without esophagitis: Secondary | ICD-10-CM | POA: Diagnosis not present

## 2014-05-09 DIAGNOSIS — H81399 Other peripheral vertigo, unspecified ear: Secondary | ICD-10-CM | POA: Diagnosis not present

## 2014-05-09 DIAGNOSIS — I10 Essential (primary) hypertension: Secondary | ICD-10-CM | POA: Diagnosis not present

## 2014-05-09 DIAGNOSIS — H409 Unspecified glaucoma: Secondary | ICD-10-CM | POA: Diagnosis not present

## 2014-05-09 LAB — T4, FREE: Free T4: 1.24 ng/dL (ref 0.80–1.80)

## 2014-05-09 MED ORDER — MECLIZINE HCL 12.5 MG PO TABS: 12.5000 mg | ORAL_TABLET | Freq: Three times a day (TID) | ORAL | Status: DC | PRN

## 2014-05-09 MED ORDER — AMLODIPINE BESYLATE 5 MG PO TABS: 5.0000 mg | ORAL_TABLET | Freq: Every day | ORAL | Status: DC

## 2014-05-09 MED ORDER — HYDROCODONE-ACETAMINOPHEN 5-325 MG PO TABS
1.0000 | ORAL_TABLET | Freq: Once | ORAL | Status: DC
  Filled 2014-05-09: qty 1

## 2014-05-09 MED ORDER — AMLODIPINE BESYLATE 5 MG PO TABS
5.0000 mg | ORAL_TABLET | Freq: Every day | ORAL | Status: DC
  Filled 2014-05-09: qty 1

## 2014-05-09 NOTE — Progress Notes (Signed)
Pt has had increased BP. Maryland Pink MD made aware.

## 2014-05-09 NOTE — Discharge Instructions (Signed)
Benign Positional Vertigo Vertigo means you feel like you or your surroundings are moving when they are not. Benign positional vertigo is the most common form of vertigo. Benign means that the cause of your condition is not serious. Benign positional vertigo is more common in older adults. CAUSES  Benign positional vertigo is the result of an upset in the labyrinth system. This is an area in the middle ear that helps control your balance. This may be caused by a viral infection, head injury, or repetitive motion. However, often no specific cause is found. SYMPTOMS  Symptoms of benign positional vertigo occur when you move your head or eyes in different directions. Some of the symptoms may include:  Loss of balance and falls.  Vomiting.  Blurred vision.  Dizziness.  Nausea.  Involuntary eye movements (nystagmus). DIAGNOSIS  Benign positional vertigo is usually diagnosed by physical exam. If the specific cause of your benign positional vertigo is unknown, your caregiver may perform imaging tests, such as magnetic resonance imaging (MRI) or computed tomography (CT). TREATMENT  Your caregiver may recommend movements or procedures to correct the benign positional vertigo. Medicines such as meclizine, benzodiazepines, and medicines for nausea may be used to treat your symptoms. In rare cases, if your symptoms are caused by certain conditions that affect the inner ear, you may need surgery. HOME CARE INSTRUCTIONS   Follow your caregiver's instructions.  Move slowly. Do not make sudden body or head movements.  Avoid driving.  Avoid operating heavy machinery.  Avoid performing any tasks that would be dangerous to you or others during a vertigo episode.  Drink enough fluids to keep your urine clear or pale yellow. SEEK IMMEDIATE MEDICAL CARE IF:   You develop problems with walking, weakness, numbness, or using your arms, hands, or legs.  You have difficulty speaking.  You develop  severe headaches.  Your nausea or vomiting continues or gets worse.  You develop visual changes.  Your family or friends notice any behavioral changes.  Your condition gets worse.  You have a fever.  You develop a stiff neck or sensitivity to light. MAKE SURE YOU:   Understand these instructions.  Will watch your condition.  Will get help right away if you are not doing well or get worse. Document Released: 06/24/2006 Document Revised: 12/09/2011 Document Reviewed: 06/06/2011 Exit[employer] Patient Information 2015 [employer]. This information is not intended to replace advice given to you by your health care provider. Make sure you discuss any questions you have with your health care provider.    

## 2014-05-09 NOTE — Progress Notes (Signed)
Physical Therapy Treatment Patient Details Name: Judith [name] MRN: [id number] DOB: [birth year] Today's Date: 05/09/2014    History of Present Illness Pt admit with vertigo.    PT Comments    Pt admitted with vertigo. Pt currently with functional limitations due to balance and endurance deficits due to resolving vertigo.  Pt and husband feel comfortable with pt going home with Outpt PT for vestibular rehab and RW.  Pt feeling much better overall with much less dizziness today.  Gave handouts and education to pt and husband.  Updated nurse re: recommendations.  Pt will benefit from skilled PT to increase their independence and safety with mobility to allow discharge to the venue listed below.   Follow Up Recommendations  Outpatient PT;Supervision/Assistance - 24 hour (for vestibular rehab)     Equipment Recommendations  Rolling walker with 5" wheels    Recommendations for Other Services       Precautions / Restrictions Precautions Precautions: Fall Restrictions Weight Bearing Restrictions: No    Mobility  Bed Mobility Overal bed mobility: Needs Assistance Bed Mobility: Supine to Sit     Supine to sit: Min guard     General bed mobility comments: No difficulties with bed mobility.    Transfers Overall transfer level: Needs assistance Equipment used: Rolling walker (2 wheeled) Transfers: Sit to/from Stand Sit to Stand: Supervision;Min guard         General transfer comment: Cues for hand placement only.    Ambulation/Gait Ambulation/Gait assistance: Min guard Ambulation Distance (Feet): 175 Feet Assistive device: Rolling walker (2 wheeled) Gait Pattern/deviations: Step-to pattern;Decreased stride length   Gait velocity interpretation: Below normal speed for age/gender General Gait Details: Pt able to ambulate a few steps without RW with a LOB which she self corrected.  Decided to try RW in which pt was able to ambulate with RW without LOB and was steady even with  turns.  Did discuss compensation with pt for turns using method of turning eyes, then head then body.  Pt overall did well with RW and will recommend a RW for home.  Feels comfortable she and her husband can manage with RW at home.     Stairs            Wheelchair Mobility    Modified Rankin (Stroke Patients Only)       Balance Overall balance assessment: Needs assistance;History of Falls Sitting-balance support: No upper extremity supported;Feet supported Sitting balance-Leahy Scale: Fair     Standing balance support: Bilateral upper extremity supported;During functional activity Standing balance-Leahy Scale: Fair Standing balance comment: Pt can stand statically without device for up to a minute.  Relies on device for dynamic activities               High level balance activites: Direction changes;Turns;Head turns High Level Balance Comments: min guard assist for above activities.     Cognition Arousal/Alertness: Awake/alert Behavior During Therapy: WFL for tasks assessed/performed Overall Cognitive Status: Within Functional Limits for tasks assessed                      Exercises Other Exercises Other Exercises: Instructed pt and husband re: Laruth Bouchard Daroff exercises.  Pt understands to only perform if dizziness persists.  Handout given.  Also gave handout to explain BPPV and Epley maneuver and why this maneuver helps.      General Comments General comments (skin integrity, edema, etc.): 16/24 on DGI suggestive of need for RW for balance upon d/c.  PErformed supine head roll which was negative.  PErformed hallpike dix bil with left negative and right positive.  Treated pt for Right posterior canal BPPV using bed to assist with positioning so that pt hopefully would not get as sick.  Pt tolerated treatment well.  Pt was 0/10 dizzy upon arrival to room.  1/10 dizzy with treatment and 2/10 dizzy with ambulation.  Dizzy once in chair after treatment was 0/10.         Pertinent Vitals/Pain Pain Assessment: No/denies pain    Home Living                      Prior Function            PT Goals (current goals can now be found in the care plan section) Progress towards PT goals: Progressing toward goals    Frequency  Min 3X/week    PT Plan Current plan remains appropriate    Co-evaluation             End of Session Equipment Utilized During Treatment: Gait belt Activity Tolerance: Patient limited by fatigue Patient left: with call bell/phone within reach;in chair;with family/visitor present;with chair alarm set     Time: [phone] PT Time Calculation (min): 52 min  Charges:  $Gait Training: 8-22 mins $Therapeutic Exercise: 8-22 mins $Canalith Rep Proc: 8-22 mins                    G Codes:  Functional Assessment Tool Used: clinical judgment Functional Limitation: Mobility: Walking and moving around Mobility: Walking and Moving Around Goal Status [phone]): At least 1 percent but less than 20 percent impaired, limited or restricted Mobility: Walking and Moving Around Discharge Status [phone]): At least 1 percent but less than 20 percent impaired, limited or restricted   INGOLD,Myrlene Riera 05/09/2014, 12:02 PM [facility] Acute [facility] [phone] (pager)

## 2014-05-09 NOTE — Progress Notes (Signed)
Pt refused to take vicodin and says it make her sick. Notified to Lamar Blinks, NP .

## 2014-05-09 NOTE — Discharge Summary (Signed)
Discharge Summary  Judith [name] JEH:[id number] DOB: 11-13-1945  PCP: Jerlyn Ly, MD  Admit date: 05/07/2014 Discharge date: 05/09/2014  Time spent: 25 minutes  Recommendations for Outpatient Follow-up:  1. Patient will followup with him next month at that time he can assess her Norvasc 2. Patient being referred to outpatient physical therapy for vestibular exercises 3. New medication Norvasc 5 mg by mouth daily  4. New medication: When necessary Antivert  Discharge Diagnoses:  Active Hospital Problems   Diagnosis Date Noted  . Vertigo 05/07/2014  . Hyperglycemia 05/07/2014  . GERD (gastroesophageal reflux disease) 05/07/2014  . Glaucoma 05/07/2014  . HTN (hypertension) 07/04/2011  . Mixed hyperlipidemia 07/04/2011    Resolved Hospital Problems   Diagnosis Date Noted Date Resolved  No resolved problems to display.    Discharge Condition: Improved, being discharged home  Diet recommendation: Low-sodium  Filed Weights   05/07/14 0455 05/07/14 1455  Weight: 70.761 kg (156 lb) 71.867 kg (158 lb 7 oz)    History of present illness:  68 year old white female with past medical history of diet-controlled hypertension presented to the emergency room on 8/8 with complaints of room spinning like symptoms starting the day prior. MRI noted no evidence of CVA patient was admitted to the hospitalist service given uncontrolled symptoms.  Hospital Course:  Principal Problem:   Vertigo: With CVA ruled out, benign positional vertigo. Patient evaluated by physical therapy who recommended outpatient physical therapy with vestibular exercises. By day of discharge, patient's symptoms better, but still persistent Patient treated with Antivert and received a prescription for discharge. Active Problems:   HTN (hypertension): Normal diet controlled. Her sugars have been elevated during her hospitalization the 160s. She was discharged on oral Norvasc 5 mg by mouth daily. She will followup with  her PCP to determine if she needs to continue this.    Mixed hyperlipidemia: Stable. Continue statin    GERD (gastroesophageal reflux disease): Stable. Continue PPI   Glaucoma: Stable. Continue on drops   Procedures:  None  Consultations:  None  Discharge Exam: BP 166/83  Pulse 69  Temp(Src) 98.8 F (37.1 C) (Oral)  Resp 16  Ht 5\' 5"  (1.651 m)  Wt 71.867 kg (158 lb 7 oz)  BMI 26.37 kg/m2  SpO2 99%  LMP 09/30/1993  General: Alert and oriented x3, no acute distress Cardiovascular: Regular rate and rhythm, S1-S2 Respiratory: Clear to auscultation bilaterally  Discharge Instructions You were cared for by a hospitalist during your hospital stay. If you have any questions about your discharge medications or the care you received while you were in the hospital after you are discharged, you can call the unit and asked to speak with the hospitalist on call if the hospitalist that took care of you is not available. Once you are discharged, your primary care physician will handle any further medical issues. Please note that NO REFILLS for any discharge medications will be authorized once you are discharged, as it is imperative that you return to your primary care physician (or establish a relationship with a primary care physician if you do not have one) for your aftercare needs so that they can reassess your need for medications and monitor your lab values.  Discharge Instructions   Diet - low sodium heart healthy    Complete by:  As directed      Increase activity slowly    Complete by:  As directed             Medication List  amLODipine 5 MG tablet  Commonly known as:  NORVASC  Take 1 tablet (5 mg total) by mouth daily.     aspirin 81 MG tablet  Take 81 mg by mouth daily.     calcium carbonate 200 MG capsule  Take 250 mg by mouth daily.     estradiol 0.1 MG/GM vaginal cream  Commonly known as:  ESTRACE  Use 1 g vaginally twice weekly     latanoprost 0.005  % ophthalmic solution  Commonly known as:  XALATAN  Place 1 drop into both eyes at bedtime.     meclizine 12.5 MG tablet  Commonly known as:  ANTIVERT  Take 1 tablet (12.5 mg total) by mouth 3 (three) times daily as needed for dizziness.     multivitamin capsule  Take 1 capsule by mouth daily.     simvastatin 20 MG tablet  Commonly known as:  ZOCOR  Take 10 mg by mouth at bedtime.     timolol 0.5 % ophthalmic solution  Commonly known as:  TIMOPTIC  Place 1 drop into both eyes daily.       Allergies  Allergen Reactions  . Erythromycin Other (See Comments)    GI upset  . Penicillins Rash  . Sulfa Antibiotics Rash       Follow-up Information   Follow up with Crist Infante A, MD In 1 month. (As needed)    Specialty:  Internal Medicine   Contact information:   [address] [place] [address] [phone]       Follow up On 06/10/2014. (APPT @ 06/10/2014 @ 11:30AM. PLEASE ARRIVE 15 MINUTES EARLY.)        The results of significant diagnostics from this hospitalization (including imaging, microbiology, ancillary and laboratory) are listed below for reference.    Significant Diagnostic Studies: Mr Brain Wo Contrast  05/07/2014     FINDINGS: 1.  No acute intracranial abnormality. 2. Mild for age nonspecific cerebral white matter signal changes.   Electronically Signed   By: Lars Pinks M.D.   On: 05/07/2014 09:52    Microbiology: No results found for this or any previous visit (from the past 240 hour(s)).   Labs: Basic Metabolic Panel:  Recent Labs Lab 05/07/14 0512 05/07/14 1505 05/08/14 0450  NA 142  --  140  K 4.4  --  4.2  CL 103  --  106  CO2 26  --  24  GLUCOSE 122*  --  98  BUN 16  --  14  CREATININE 0.89 0.83 0.80  CALCIUM 9.4  --  8.6   Liver Function Tests:  Recent Labs Lab 05/07/14 0512  AST 18  ALT 15  ALKPHOS 53  BILITOT 0.3  PROT 7.3  ALBUMIN 3.8   No results found for this basename: LIPASE, AMYLASE,  in the last 168 hours No  results found for this basename: AMMONIA,  in the last 168 hours CBC:  Recent Labs Lab 05/07/14 0512 05/07/14 1505 05/08/14 0450  WBC 9.3 7.3 6.9  NEUTROABS 6.7  --   --   HGB 13.7 12.9 12.4  HCT 41.8 38.7 37.5  MCV 93.1 92.4 93.8  PLT 278 265 257   Cardiac Enzymes:  Recent Labs Lab 05/07/14 0512  TROPONINI <0.30   BNP: BNP (last 3 results) No results found for this basename: PROBNP,  in the last 8760 hours CBG: No results found for this basename: GLUCAP,  in the last 168 hours     Signed:  Annita Brod  [facility] 05/09/2014, 4:10 PM

## 2014-05-09 NOTE — Progress Notes (Signed)
Meryem C Heinke to be D/C'd Home per MD order.  Discussed with the patient and all questions fully answered.    Medication List         amLODipine 5 MG tablet  Commonly known as:  NORVASC  Take 1 tablet (5 mg total) by mouth daily.     aspirin 81 MG tablet  Take 81 mg by mouth daily.     calcium carbonate 200 MG capsule  Take 250 mg by mouth daily.     estradiol 0.1 MG/GM vaginal cream  Commonly known as:  ESTRACE  Use 1 g vaginally twice weekly     latanoprost 0.005 % ophthalmic solution  Commonly known as:  XALATAN  Place 1 drop into both eyes at bedtime.     meclizine 12.5 MG tablet  Commonly known as:  ANTIVERT  Take 1 tablet (12.5 mg total) by mouth 3 (three) times daily as needed for dizziness.     multivitamin capsule  Take 1 capsule by mouth daily.     simvastatin 20 MG tablet  Commonly known as:  ZOCOR  Take 10 mg by mouth at bedtime.     timolol 0.5 % ophthalmic solution  Commonly known as:  TIMOPTIC  Place 1 drop into both eyes daily.        VVS, Skin clean, dry and intact without evidence of skin break down, no evidence of skin tears noted. IV catheter discontinued intact. Site without signs and symptoms of complications. Dressing and pressure applied.  An After Visit Summary was printed and given to the patient.  D/c education completed with patient/family including follow up instructions, medication list, d/c activities limitations if indicated, with other d/c instructions as indicated by MD - patient able to verbalize understanding, all questions fully answered.   Patient instructed to return to ED, call 911, or call MD for any changes in condition.   Patient escorted via [place], and D/C home via private auto.  Audria Nine F 05/09/2014 2:41 PM

## 2014-05-12 ENCOUNTER — Ambulatory Visit: Payer: Medicare Other | Attending: Internal Medicine | Admitting: Physical Therapy

## 2014-05-12 DIAGNOSIS — R42 Dizziness and giddiness: Secondary | ICD-10-CM | POA: Diagnosis not present

## 2014-05-12 DIAGNOSIS — IMO0001 Reserved for inherently not codable concepts without codable children: Secondary | ICD-10-CM | POA: Insufficient documentation

## 2014-05-12 DIAGNOSIS — R269 Unspecified abnormalities of gait and mobility: Secondary | ICD-10-CM | POA: Diagnosis not present

## 2014-05-13 LAB — VITAMIN D 1,25 DIHYDROXY
Vitamin D 1, 25 (OH)2 Total: 57 pg/mL (ref 18–72)
Vitamin D3 1, 25 (OH)2: 57 pg/mL

## 2014-05-17 NOTE — ED Provider Notes (Signed)
Medical screening examination/treatment/procedure(s) were conducted as a shared visit with non-physician practitioner(s) and myself.  I personally evaluated the patient during the encounter.   EKG Interpretation   Date/Time:  Saturday May 07 2014 05:21:11 EDT Ventricular Rate:  65 PR Interval:  166 QRS Duration: 136 QT Interval:  435 QTC Calculation: 452 R Axis:   71 Text Interpretation:  Age not entered, assumed to be  68 years old for  purpose of ECG interpretation Sinus rhythm LAE, consider biatrial  enlargement Right bundle branch block Confirmed by Jakeria Caissie  MD, Emri Sample  ([id number]) on 05/07/2014 6:56:53 AM      68 year old patient presents with vertiginous symptoms started 24 hours prior to presentation, remitted and began this morning. She's having difficulty standing. She complains of nausea and had several episodes of vomiting. She has no focal weakness or numbness. Nystagmus present. Clear speech. CT without evidence of acute abnormality. MRI to rule out central cause of her vertigo. Will likely need admission.   EKG Interpretation   Date/Time:  Saturday May 07 2014 05:21:11 EDT Ventricular Rate:  65 PR Interval:  166 QRS Duration: 136 QT Interval:  435 QTC Calculation: 452 R Axis:   71 Text Interpretation:  Age not entered, assumed to be  68 years old for  purpose of ECG interpretation Sinus rhythm LAE, consider biatrial  enlargement Right bundle branch block Confirmed by Lita Mains  MD, Jailey Booton  ([id number]) on 05/07/2014 6:56:59 AM       Julianne Rice, MD 05/17/14 [phone]

## 2014-05-19 ENCOUNTER — Ambulatory Visit: Payer: Medicare Other | Admitting: Physical Therapy

## 2014-05-19 DIAGNOSIS — IMO0001 Reserved for inherently not codable concepts without codable children: Secondary | ICD-10-CM | POA: Diagnosis not present

## 2014-05-19 DIAGNOSIS — R269 Unspecified abnormalities of gait and mobility: Secondary | ICD-10-CM | POA: Diagnosis not present

## 2014-05-19 DIAGNOSIS — R42 Dizziness and giddiness: Secondary | ICD-10-CM | POA: Diagnosis not present

## 2014-05-25 ENCOUNTER — Encounter: Payer: Medicare Other | Admitting: Rehabilitative and Restorative Service Providers"

## 2014-06-19 DIAGNOSIS — Z23 Encounter for immunization: Secondary | ICD-10-CM | POA: Diagnosis not present

## 2014-07-01 DIAGNOSIS — R42 Dizziness and giddiness: Secondary | ICD-10-CM | POA: Diagnosis not present

## 2014-07-01 DIAGNOSIS — I10 Essential (primary) hypertension: Secondary | ICD-10-CM | POA: Diagnosis not present

## 2014-07-01 DIAGNOSIS — E785 Hyperlipidemia, unspecified: Secondary | ICD-10-CM | POA: Diagnosis not present

## 2014-07-01 DIAGNOSIS — M719 Bursopathy, unspecified: Secondary | ICD-10-CM | POA: Diagnosis not present

## 2014-07-12 DIAGNOSIS — H4011X2 Primary open-angle glaucoma, moderate stage: Secondary | ICD-10-CM | POA: Diagnosis not present

## 2014-08-01 ENCOUNTER — Encounter (HOSPITAL_COMMUNITY): Payer: Self-pay | Admitting: Emergency Medicine

## 2014-08-08 DIAGNOSIS — H6121 Impacted cerumen, right ear: Secondary | ICD-10-CM | POA: Diagnosis not present

## 2014-08-08 DIAGNOSIS — Z6826 Body mass index (BMI) 26.0-26.9, adult: Secondary | ICD-10-CM | POA: Diagnosis not present

## 2014-08-10 DIAGNOSIS — H2513 Age-related nuclear cataract, bilateral: Secondary | ICD-10-CM | POA: Diagnosis not present

## 2014-08-10 DIAGNOSIS — H4011X2 Primary open-angle glaucoma, moderate stage: Secondary | ICD-10-CM | POA: Diagnosis not present

## 2014-09-06 ENCOUNTER — Other Ambulatory Visit: Payer: Self-pay

## 2014-09-06 DIAGNOSIS — Z1231 Encounter for screening mammogram for malignant neoplasm of breast: Secondary | ICD-10-CM

## 2014-09-26 ENCOUNTER — Ambulatory Visit
Admission: RE | Admit: 2014-09-26 | Discharge: 2014-09-26 | Disposition: A | Discharge status: Home / Self Care | Payer: Medicare Other | Source: Ambulatory Visit

## 2014-09-26 DIAGNOSIS — Z1231 Encounter for screening mammogram for malignant neoplasm of breast: Secondary | ICD-10-CM

## 2014-12-21 DIAGNOSIS — H4011X2 Primary open-angle glaucoma, moderate stage: Secondary | ICD-10-CM | POA: Diagnosis not present

## 2015-01-27 ENCOUNTER — Encounter: Payer: Self-pay | Admitting: Nurse Practitioner

## 2015-01-27 ENCOUNTER — Ambulatory Visit (INDEPENDENT_AMBULATORY_CARE_PROVIDER_SITE_OTHER): Payer: Medicare Other | Admitting: Nurse Practitioner

## 2015-01-27 VITALS — BP 104/66 | HR 60 | Ht 63.5 in | Wt 152.0 lb

## 2015-01-27 DIAGNOSIS — N951 Menopausal and female climacteric states: Secondary | ICD-10-CM | POA: Diagnosis not present

## 2015-01-27 DIAGNOSIS — Z01419 Encounter for gynecological examination (general) (routine) without abnormal findings: Secondary | ICD-10-CM

## 2015-01-27 DIAGNOSIS — Z0000 Encounter for general adult medical examination without abnormal findings: Secondary | ICD-10-CM

## 2015-01-27 MED ORDER — ESTRADIOL 0.1 MG/GM VA CREA: TOPICAL_CREAM | VAGINAL | Status: DC

## 2015-01-27 NOTE — Patient Instructions (Signed)

## 2015-01-27 NOTE — Progress Notes (Signed)
Patient ID: Judith [name], female   DOB: [birth year], 69 y.o.   MRN: [id number] 69 y.o. [id number] Married  Caucasian Fe here for annual exam.  New diagnosis of vertigo with hospitalization 04/2014.  Using estrogen vaginal cream maybe once a week.  May not get a refill secondary to cost.  She and husband are going to [place] on a cruise for 39 th wedding anniversary.  Patient's last menstrual period was 09/30/1993.  Pt is postmenopausal.     Sexually active: Yes.    The current method of family planning is post menopausal status.    Exercising: Yes.    walking and strength training Smoker:  no  Health Maintenance: Pap:  01/04/14, negative  MMG:  09/26/14, Bi-Rads 1:  Negative Colonoscopy:  11/21/09, adenoma, repeat in 5 years BMD:   3/13, -1.9/-1.7/-1.5 TDaP:  01/04/14 Shingles: 5 years ago Prevnar 13: 2015 Labs: PCP   reports that she quit smoking about 31 years ago. She has never used smokeless tobacco. She reports that she drinks about 0.5 - 1.0 oz of alcohol per week. She reports that she does not use illicit drugs.  Past Medical History  Diagnosis Date  . Carotid bruit   . GERD (gastroesophageal reflux disease)   . Symptomatic PVCs   . Arthritis   . Elevated lipids   . RBBB (right bundle branch block)   . Hypertension   . Osteoarthritis     Past Surgical History  Procedure Laterality Date  . Rotator cuff repair Right 09/2002  . Refractive surgery Left 2014  . Oophorectomy Left 1969    secondary to dermoid  . Breast biopsy Left 3/97    Current Outpatient Prescriptions  Medication Sig Dispense Refill  . aspirin 81 MG tablet Take 81 mg by mouth daily.      . calcium carbonate 200 MG capsule Take 250 mg by mouth daily.      . dorzolamide-timolol (COSOPT) 22.3-6.8 MG/ML ophthalmic solution Place 1 drop into both eyes 2 (two) times daily.  99  . estradiol (ESTRACE) 0.1 MG/GM vaginal cream Use 1 g vaginally twice weekly 42.5 g 3  . latanoprost (XALATAN) 0.005 % ophthalmic solution  Place 1 drop into both eyes at bedtime.      . meclizine (ANTIVERT) 12.5 MG tablet Take 1 tablet (12.5 mg total) by mouth 3 (three) times daily as needed for dizziness. 30 tablet 0  . Multiple Vitamin (MULTIVITAMIN) capsule Take 1 capsule by mouth daily.      . simvastatin (ZOCOR) 10 MG tablet Take 10 mg by mouth at bedtime.  4   No current facility-administered medications for this visit.    Family History  Problem Relation Age of Onset  . Cancer Mother 52    throat  . Hypertension Father   . Heart disease Father 36  . Hypertension Brother   . Prostate cancer Brother 71  . Liver cancer Maternal Aunt   . Stomach cancer Maternal Grandfather     ROS:  Pertinent items are noted in HPI.  Otherwise, a comprehensive ROS was negative.  Exam:   BP 104/66 mmHg  Pulse 60  Ht 5' 3.5" (1.613 m)  Wt 152 lb (68.947 kg)  BMI 26.50 kg/m2  LMP 09/30/1993 Height: 5' 3.5" (161.3 cm) Ht Readings from Last 3 Encounters:  01/27/15 5' 3.5" (1.613 m)  05/07/14 5\' 5"  (1.651 m)  01/04/14 5' 3.75" (1.619 m)    General appearance: alert, cooperative and appears stated age Head: Normocephalic, without obvious  abnormality, atraumatic Neck: no adenopathy, supple, symmetrical, trachea midline and thyroid normal to inspection and palpation Lungs: clear to auscultation bilaterally Breasts: normal appearance, no masses or tenderness Heart: regular rate and rhythm Abdomen: soft, non-tender; no masses,  no organomegaly Extremities: extremities normal, atraumatic, no cyanosis or edema Skin: Skin color, texture, turgor normal. No rashes or lesions Lymph nodes: Cervical, supraclavicular, and axillary nodes normal. No abnormal inguinal nodes palpated Neurologic: Grossly normal   Pelvic: External genitalia:  no lesions              Urethra:  normal appearing urethra with no masses, tenderness or lesions              Bartholin's and Skene's: normal                 Vagina: normal appearing vagina with normal  color and discharge, no lesions              Cervix: anteverted              Pap taken: No. Bimanual Exam:  Uterus:  normal size, contour, position, consistency, mobility, non-tender              Adnexa: no mass, fullness, tenderness               Rectovaginal: Confirms               Anus:  normal sphincter tone, no lesions  Chaperone present:  no  A:  Well Woman with normal exam  Postmenopausal with HRT 1987 - 2014  Atrophic vaginitis - occasional use of vaginal estrogen Situational depression and anxiety History of Bundle Branch Block, HTN  New diagnosis of dizziness last year X 1 occurence   P:   Reviewed health and wellness pertinent to exam  Pap smear not taken today  Mammogram is due 12/16  Refill for Estrace vaginal cream in case she decides to use - print  Counseled with risk of CVA, DVT, cancer, etc  May use Vaginal E suppositories  Counseled on breast self exam, mammography screening, use and side effects of HRT, adequate intake of calcium and vitamin D, diet and exercise return annually or prn  An After Visit Summary was printed and given to the patient.

## 2015-01-28 NOTE — Progress Notes (Signed)
Encounter reviewed by Dr. Deara Bober Silva.  

## 2015-03-06 ENCOUNTER — Other Ambulatory Visit: Payer: Self-pay | Admitting: Gastroenterology

## 2015-03-06 DIAGNOSIS — D120 Benign neoplasm of cecum: Secondary | ICD-10-CM | POA: Diagnosis not present

## 2015-03-06 DIAGNOSIS — Z09 Encounter for follow-up examination after completed treatment for conditions other than malignant neoplasm: Secondary | ICD-10-CM | POA: Diagnosis not present

## 2015-03-06 DIAGNOSIS — D126 Benign neoplasm of colon, unspecified: Secondary | ICD-10-CM | POA: Diagnosis not present

## 2015-03-06 DIAGNOSIS — K5730 Diverticulosis of large intestine without perforation or abscess without bleeding: Secondary | ICD-10-CM | POA: Diagnosis not present

## 2015-03-06 DIAGNOSIS — Z86010 Personal history of colonic polyps: Secondary | ICD-10-CM | POA: Diagnosis not present

## 2015-03-06 LAB — HM COLONOSCOPY

## 2015-04-05 DIAGNOSIS — R110 Nausea: Secondary | ICD-10-CM | POA: Diagnosis not present

## 2015-04-05 DIAGNOSIS — M7611 Psoas tendinitis, right hip: Secondary | ICD-10-CM | POA: Diagnosis not present

## 2015-04-05 DIAGNOSIS — Z6823 Body mass index (BMI) 23.0-23.9, adult: Secondary | ICD-10-CM | POA: Diagnosis not present

## 2015-04-11 DIAGNOSIS — I739 Peripheral vascular disease, unspecified: Secondary | ICD-10-CM | POA: Diagnosis not present

## 2015-04-11 DIAGNOSIS — M859 Disorder of bone density and structure, unspecified: Secondary | ICD-10-CM | POA: Diagnosis not present

## 2015-04-11 DIAGNOSIS — I10 Essential (primary) hypertension: Secondary | ICD-10-CM | POA: Diagnosis not present

## 2015-04-11 DIAGNOSIS — E785 Hyperlipidemia, unspecified: Secondary | ICD-10-CM | POA: Diagnosis not present

## 2015-04-19 DIAGNOSIS — I6529 Occlusion and stenosis of unspecified carotid artery: Secondary | ICD-10-CM | POA: Diagnosis not present

## 2015-04-19 DIAGNOSIS — Z0000 Encounter for general adult medical examination without abnormal findings: Secondary | ICD-10-CM | POA: Diagnosis not present

## 2015-04-19 DIAGNOSIS — M719 Bursopathy, unspecified: Secondary | ICD-10-CM | POA: Diagnosis not present

## 2015-04-19 DIAGNOSIS — E785 Hyperlipidemia, unspecified: Secondary | ICD-10-CM | POA: Diagnosis not present

## 2015-04-19 DIAGNOSIS — M4184 Other forms of scoliosis, thoracic region: Secondary | ICD-10-CM | POA: Diagnosis not present

## 2015-04-19 DIAGNOSIS — Z1389 Encounter for screening for other disorder: Secondary | ICD-10-CM | POA: Diagnosis not present

## 2015-04-19 DIAGNOSIS — M545 Low back pain: Secondary | ICD-10-CM | POA: Diagnosis not present

## 2015-04-19 DIAGNOSIS — N951 Menopausal and female climacteric states: Secondary | ICD-10-CM | POA: Diagnosis not present

## 2015-04-19 DIAGNOSIS — I4510 Unspecified right bundle-branch block: Secondary | ICD-10-CM | POA: Diagnosis not present

## 2015-04-19 DIAGNOSIS — K5730 Diverticulosis of large intestine without perforation or abscess without bleeding: Secondary | ICD-10-CM | POA: Diagnosis not present

## 2015-04-19 DIAGNOSIS — M7611 Psoas tendinitis, right hip: Secondary | ICD-10-CM | POA: Diagnosis not present

## 2015-04-19 DIAGNOSIS — Z6825 Body mass index (BMI) 25.0-25.9, adult: Secondary | ICD-10-CM | POA: Diagnosis not present

## 2015-06-15 DIAGNOSIS — Z23 Encounter for immunization: Secondary | ICD-10-CM | POA: Diagnosis not present

## 2015-06-15 DIAGNOSIS — M1380 Other specified arthritis, unspecified site: Secondary | ICD-10-CM | POA: Diagnosis not present

## 2015-06-15 DIAGNOSIS — Z6826 Body mass index (BMI) 26.0-26.9, adult: Secondary | ICD-10-CM | POA: Diagnosis not present

## 2015-07-03 DIAGNOSIS — H52203 Unspecified astigmatism, bilateral: Secondary | ICD-10-CM | POA: Diagnosis not present

## 2015-07-03 DIAGNOSIS — H2513 Age-related nuclear cataract, bilateral: Secondary | ICD-10-CM | POA: Diagnosis not present

## 2015-07-03 DIAGNOSIS — H401132 Primary open-angle glaucoma, bilateral, moderate stage: Secondary | ICD-10-CM | POA: Diagnosis not present

## 2015-07-27 DIAGNOSIS — H268 Other specified cataract: Secondary | ICD-10-CM | POA: Diagnosis not present

## 2015-07-27 DIAGNOSIS — H25812 Combined forms of age-related cataract, left eye: Secondary | ICD-10-CM | POA: Diagnosis not present

## 2015-07-27 DIAGNOSIS — H25012 Cortical age-related cataract, left eye: Secondary | ICD-10-CM | POA: Diagnosis not present

## 2015-09-13 ENCOUNTER — Other Ambulatory Visit: Payer: Self-pay

## 2015-09-13 DIAGNOSIS — Z1231 Encounter for screening mammogram for malignant neoplasm of breast: Secondary | ICD-10-CM

## 2015-09-27 DIAGNOSIS — Z961 Presence of intraocular lens: Secondary | ICD-10-CM | POA: Diagnosis not present

## 2015-10-05 ENCOUNTER — Ambulatory Visit
Admission: RE | Admit: 2015-10-05 | Discharge: 2015-10-05 | Disposition: A | Discharge status: Home / Self Care | Payer: Medicare Other | Source: Ambulatory Visit

## 2015-10-05 DIAGNOSIS — Z1231 Encounter for screening mammogram for malignant neoplasm of breast: Secondary | ICD-10-CM

## 2016-02-02 ENCOUNTER — Ambulatory Visit: Payer: Medicare Other | Admitting: Nurse Practitioner

## 2016-03-05 DIAGNOSIS — H401122 Primary open-angle glaucoma, left eye, moderate stage: Secondary | ICD-10-CM | POA: Diagnosis not present

## 2016-03-05 DIAGNOSIS — H401112 Primary open-angle glaucoma, right eye, moderate stage: Secondary | ICD-10-CM | POA: Diagnosis not present

## 2016-03-05 DIAGNOSIS — H2511 Age-related nuclear cataract, right eye: Secondary | ICD-10-CM | POA: Diagnosis not present

## 2016-03-11 DIAGNOSIS — M859 Disorder of bone density and structure, unspecified: Secondary | ICD-10-CM | POA: Diagnosis not present

## 2016-06-21 DIAGNOSIS — E784 Other hyperlipidemia: Secondary | ICD-10-CM | POA: Diagnosis not present

## 2016-06-21 DIAGNOSIS — I10 Essential (primary) hypertension: Secondary | ICD-10-CM | POA: Diagnosis not present

## 2016-06-21 DIAGNOSIS — M859 Disorder of bone density and structure, unspecified: Secondary | ICD-10-CM | POA: Diagnosis not present

## 2016-06-28 DIAGNOSIS — K5730 Diverticulosis of large intestine without perforation or abscess without bleeding: Secondary | ICD-10-CM | POA: Diagnosis not present

## 2016-06-28 DIAGNOSIS — R42 Dizziness and giddiness: Secondary | ICD-10-CM | POA: Diagnosis not present

## 2016-06-28 DIAGNOSIS — M719 Bursopathy, unspecified: Secondary | ICD-10-CM | POA: Diagnosis not present

## 2016-06-28 DIAGNOSIS — I7389 Other specified peripheral vascular diseases: Secondary | ICD-10-CM | POA: Diagnosis not present

## 2016-06-28 DIAGNOSIS — M7611 Psoas tendinitis, right hip: Secondary | ICD-10-CM | POA: Diagnosis not present

## 2016-06-28 DIAGNOSIS — I6529 Occlusion and stenosis of unspecified carotid artery: Secondary | ICD-10-CM | POA: Diagnosis not present

## 2016-06-28 DIAGNOSIS — J3089 Other allergic rhinitis: Secondary | ICD-10-CM | POA: Diagnosis not present

## 2016-06-28 DIAGNOSIS — E784 Other hyperlipidemia: Secondary | ICD-10-CM | POA: Diagnosis not present

## 2016-06-28 DIAGNOSIS — Z0000 Encounter for general adult medical examination without abnormal findings: Secondary | ICD-10-CM | POA: Diagnosis not present

## 2016-06-28 DIAGNOSIS — M545 Low back pain: Secondary | ICD-10-CM | POA: Diagnosis not present

## 2016-06-28 DIAGNOSIS — Z6827 Body mass index (BMI) 27.0-27.9, adult: Secondary | ICD-10-CM | POA: Diagnosis not present

## 2016-06-28 DIAGNOSIS — Z23 Encounter for immunization: Secondary | ICD-10-CM | POA: Diagnosis not present

## 2016-06-28 DIAGNOSIS — Z1389 Encounter for screening for other disorder: Secondary | ICD-10-CM | POA: Diagnosis not present

## 2016-07-03 ENCOUNTER — Other Ambulatory Visit (HOSPITAL_COMMUNITY): Payer: Self-pay | Admitting: Internal Medicine

## 2016-07-03 DIAGNOSIS — I6523 Occlusion and stenosis of bilateral carotid arteries: Secondary | ICD-10-CM

## 2016-07-03 DIAGNOSIS — I739 Peripheral vascular disease, unspecified: Secondary | ICD-10-CM

## 2016-07-08 ENCOUNTER — Ambulatory Visit (INDEPENDENT_AMBULATORY_CARE_PROVIDER_SITE_OTHER): Payer: Medicare Other | Admitting: Nurse Practitioner

## 2016-07-08 ENCOUNTER — Encounter: Payer: Self-pay | Admitting: Nurse Practitioner

## 2016-07-08 VITALS — BP 118/76 | HR 64 | Ht 63.25 in | Wt 158.0 lb

## 2016-07-08 DIAGNOSIS — N951 Menopausal and female climacteric states: Secondary | ICD-10-CM

## 2016-07-08 DIAGNOSIS — Z01419 Encounter for gynecological examination (general) (routine) without abnormal findings: Secondary | ICD-10-CM | POA: Diagnosis not present

## 2016-07-08 DIAGNOSIS — Z124 Encounter for screening for malignant neoplasm of cervix: Secondary | ICD-10-CM

## 2016-07-08 DIAGNOSIS — N952 Postmenopausal atrophic vaginitis: Secondary | ICD-10-CM | POA: Diagnosis not present

## 2016-07-08 MED ORDER — ESTRADIOL 0.1 MG/GM VA CREA: TOPICAL_CREAM | VAGINAL | 3 refills | Status: DC

## 2016-07-08 NOTE — Progress Notes (Signed)
Patient ID: Judith [name], female   DOB: Dec 24, 1945, 70 y.o.   MRN: [id number]  70 y.o. [id number] Married  Caucasian Fe here for annual exam.  No new problems this year.  Has history of carotid stenosis at < 40% and will be having another doppler study 07/15/16 both at carotid and inguinal.  They had a good time on 24 th wedding anniversary trip on [place] cruise.  Patient's last menstrual period was 09/30/1993.          Sexually active: Yes.    The current method of family planning is post menopausal status.    Exercising: Yes.    walking about one mile daily Smoker:  no  Health Maintenance: Pap:  01/04/14, Negative  MMG: 10/05/15, Bi-Rads 1:  Negative Colonoscopy: 03/06/15, tubular adenoma, repeat in 2 years BMD: 02/2016, Dr. Joylene Draft, same as last exam, osteopenia TDaP:  01/04/14 Shingles: 09/30/05 Pneumonia: 10/01/11, 2015 Prevnar 13 Hep C: 05/2016 Labs: PCP takes care of all labs (05/2016)   reports that she quit smoking about 32 years ago. She has a 20.00 pack-year smoking history. She has never used smokeless tobacco. She reports that she drinks about 0.5 - 1.0 oz of alcohol per week . She reports that she does not use drugs.  Past Medical History:  Diagnosis Date  . Arthritis   . Carotid bruit   . Elevated lipids   . GERD (gastroesophageal reflux disease)   . Hypertension   . Osteoarthritis   . RBBB (right bundle branch block)   . Symptomatic PVCs     Past Surgical History:  Procedure Laterality Date  . BREAST BIOPSY Left 3/97  . OOPHORECTOMY Left 1969   secondary to dermoid  . REFRACTIVE SURGERY Left 2014  . ROTATOR CUFF REPAIR Right 09/2002    Current Outpatient Prescriptions  Medication Sig Dispense Refill  . aspirin 81 MG tablet Take 81 mg by mouth daily.      . calcium carbonate 200 MG capsule Take 250 mg by mouth daily.      . dorzolamide-timolol (COSOPT) 22.3-6.8 MG/ML ophthalmic solution Place 1 drop into both eyes 2 (two) times daily.  99  . estradiol (ESTRACE) 0.1 MG/GM  vaginal cream Use 1 g vaginally twice weekly 42.5 g 3  . latanoprost (XALATAN) 0.005 % ophthalmic solution Place 1 drop into both eyes at bedtime.      . meclizine (ANTIVERT) 12.5 MG tablet Take 1 tablet (12.5 mg total) by mouth 3 (three) times daily as needed for dizziness. 30 tablet 0  . Multiple Vitamin (MULTIVITAMIN) capsule Take 1 capsule by mouth daily.      . simvastatin (ZOCOR) 10 MG tablet Take 10 mg by mouth at bedtime.  4   No current facility-administered medications for this visit.     Family History  Problem Relation Age of Onset  . Cancer Mother 93    throat  . Hypertension Father   . Heart disease Father 5  . Hypertension Brother   . Prostate cancer Brother 63  . Liver cancer Maternal Aunt   . Stomach cancer Maternal Grandfather     ROS:  Pertinent items are noted in HPI.  Otherwise, a comprehensive ROS was negative.  Exam:   LMP 09/30/1993    Ht Readings from Last 3 Encounters:  01/27/15 5' 3.5" (1.613 m)  05/07/14 5\' 5"  (1.651 m)  01/04/14 5' 3.75" (1.619 m)    General appearance: alert, cooperative and appears stated age Head: Normocephalic, without obvious abnormality, atraumatic  Neck: no adenopathy, supple, symmetrical, trachea midline and thyroid normal to inspection and palpation Lungs: clear to auscultation bilaterally Breasts: normal appearance, no masses or tenderness Heart: regular rate and rhythm Abdomen: soft, non-tender; no masses,  no organomegaly Extremities: extremities normal, atraumatic, no cyanosis or edema Skin: Skin color, texture, turgor normal. No rashes or lesions Lymph nodes: Cervical, supraclavicular, and axillary nodes normal. No abnormal inguinal nodes palpated Neurologic: Grossly normal   Pelvic: External genitalia:  no lesions              Urethra:  normal appearing urethra with no masses, tenderness or lesions              Bartholin's and Skene's: normal                 Vagina: normal appearing vagina with normal color  and discharge, no lesions              Cervix: anteverted              Pap taken: No. - but request one for next year Bimanual Exam:  Uterus:  normal size, contour, position, consistency, mobility, non-tender              Adnexa: no mass, fullness, tenderness               Rectovaginal: Confirms               Anus:  normal sphincter tone, no lesions  Chaperone present: yes  A:  Well Woman with normal exam  Postmenopausal with HRT 1987 - 2014             Atrophic vaginitis - occasional use of vaginal estrogen Situational depression and anxiety History of Bundle Branch Block, HTN  P:   Reviewed health and wellness pertinent to exam  Pap smear not done  Mammogram is due 09/2016  Refill on vaginal estrogen to use as directed  Counseled with risk of DVT, CVA, cancer.  If her doppler studies are worse she is advised that it would be safer if she DC vaginal estrogen.  Counseled on breast self exam, mammography screening, use and side effects of HRT, adequate intake of calcium and vitamin D, diet and exercise return annually or prn  An After Visit Summary was printed and given to the patient.

## 2016-07-08 NOTE — Patient Instructions (Signed)

## 2016-07-09 NOTE — Progress Notes (Signed)
Encounter reviewed by Dr. Brook Amundson C. Silva.  

## 2016-07-10 DIAGNOSIS — H401111 Primary open-angle glaucoma, right eye, mild stage: Secondary | ICD-10-CM | POA: Diagnosis not present

## 2016-07-10 DIAGNOSIS — H2513 Age-related nuclear cataract, bilateral: Secondary | ICD-10-CM | POA: Diagnosis not present

## 2016-07-10 DIAGNOSIS — H524 Presbyopia: Secondary | ICD-10-CM | POA: Diagnosis not present

## 2016-07-10 DIAGNOSIS — H401122 Primary open-angle glaucoma, left eye, moderate stage: Secondary | ICD-10-CM | POA: Diagnosis not present

## 2016-07-15 ENCOUNTER — Ambulatory Visit (HOSPITAL_COMMUNITY)
Admission: RE | Admit: 2016-07-15 | Discharge: 2016-07-15 | Disposition: A | Discharge status: Home / Self Care | Payer: Medicare Other | Source: Ambulatory Visit | Attending: Internal Medicine | Admitting: Internal Medicine

## 2016-07-15 DIAGNOSIS — Z87891 Personal history of nicotine dependence: Secondary | ICD-10-CM | POA: Diagnosis not present

## 2016-07-15 DIAGNOSIS — I10 Essential (primary) hypertension: Secondary | ICD-10-CM | POA: Diagnosis not present

## 2016-07-15 DIAGNOSIS — I6523 Occlusion and stenosis of bilateral carotid arteries: Secondary | ICD-10-CM | POA: Diagnosis not present

## 2016-07-15 DIAGNOSIS — E785 Hyperlipidemia, unspecified: Secondary | ICD-10-CM | POA: Diagnosis not present

## 2016-07-15 DIAGNOSIS — R938 Abnormal findings on diagnostic imaging of other specified body structures: Secondary | ICD-10-CM | POA: Insufficient documentation

## 2016-07-15 DIAGNOSIS — I739 Peripheral vascular disease, unspecified: Secondary | ICD-10-CM | POA: Diagnosis not present

## 2016-07-15 IMAGING — MR MR HEAD W/O CM
13 of 14 series · 42 of 48 positions shown · non-contrast
Comparison: Cerebral volume is normal.

CLINICAL DATA: 68-year-old female with acute onset dizziness and
vertigo. Initial encounter.

EXAM:
MRI HEAD WITHOUT CONTRAST
TECHNIQUE: Multiplanar, multiecho pulse sequences of the brain and surrounding
structures were obtained without intravenous contrast.

[Series 3: T2 · axial · 5.0mm · 0.43mm/px · 1 of 24 slices shown (1 of 2)]
[im 1/24]
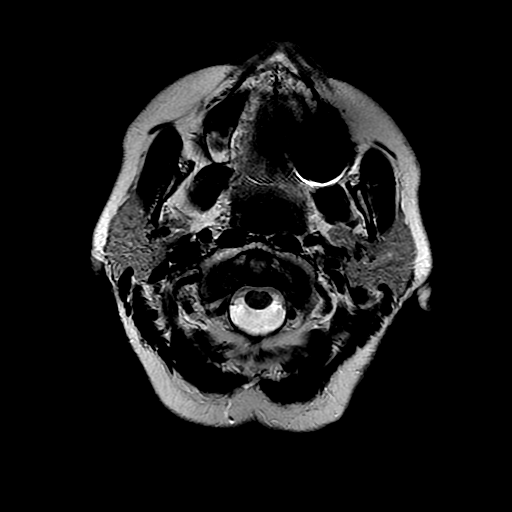

[Series 4: T1 · sagittal · 5.0mm · 0.47mm/px · 1 of 22 slices shown]
[im 1/22]
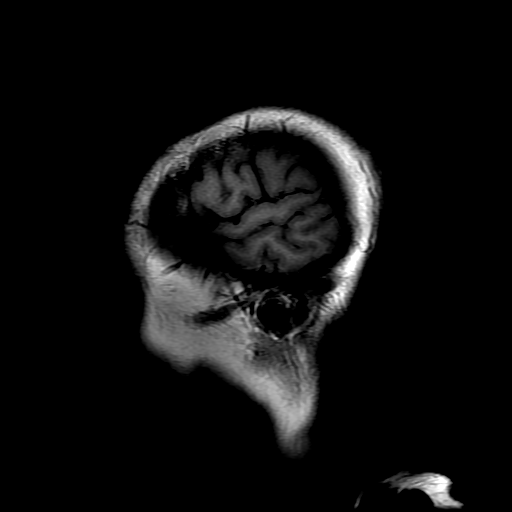

[Series 5: DWI · axial · 5.0mm · 1.09mm/px · z∈[-103,+37]mm · 4 of 60 slices shown (1 of 8)]
[im 1/60]
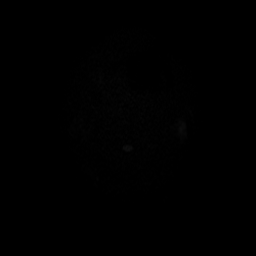
[im 20/60]
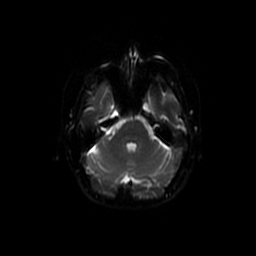
[im 40/60]
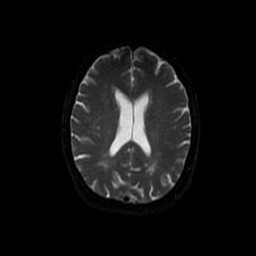
[im 60/60]
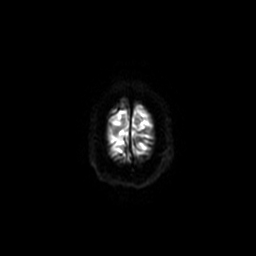

[Series 6: DWI · coronal · 5.0mm · 1.09mm/px · 4 of 66 slices shown (2 of 8)]
[im 1/66]
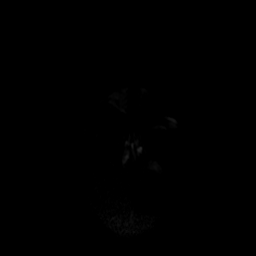
[im 22/66]
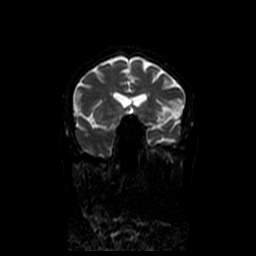
[im 44/66]
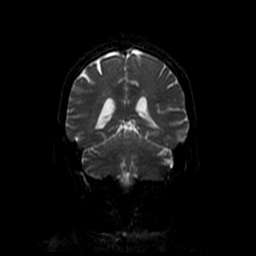
[im 66/66]
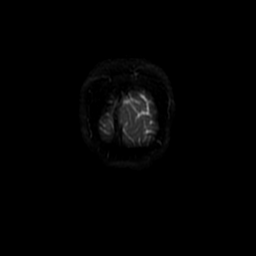

[Series 7: FLAIR · axial · 5.0mm · 0.43mm/px · 1 of 24 slices shown]
[im 1/24]
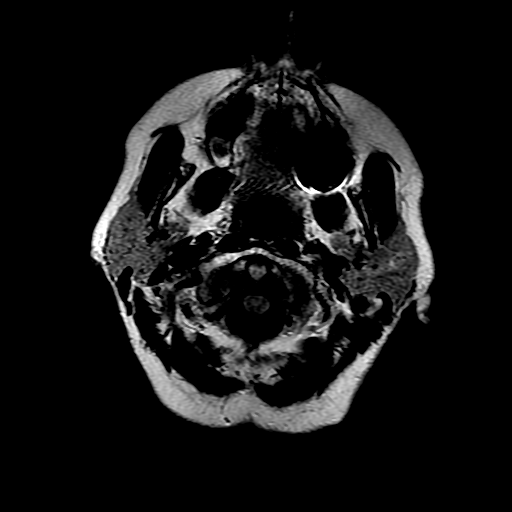

[Series 8: ax mpgr · axial · 5.0mm · 0.43mm/px · 1 of 24 slices shown]
[im 1/24]
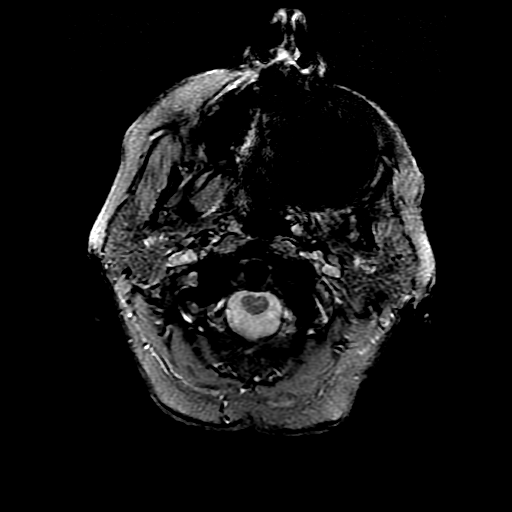

[Series 10: T2 · coronal · 5.0mm · 0.43mm/px · 2 of 28 slices shown (2 of 2)]
[im 1/28]
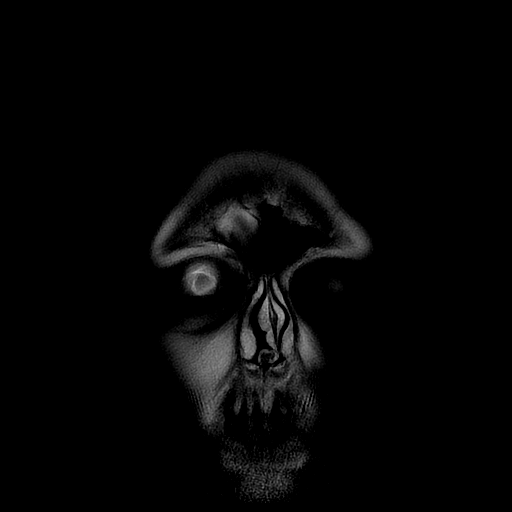
[im 28/28]
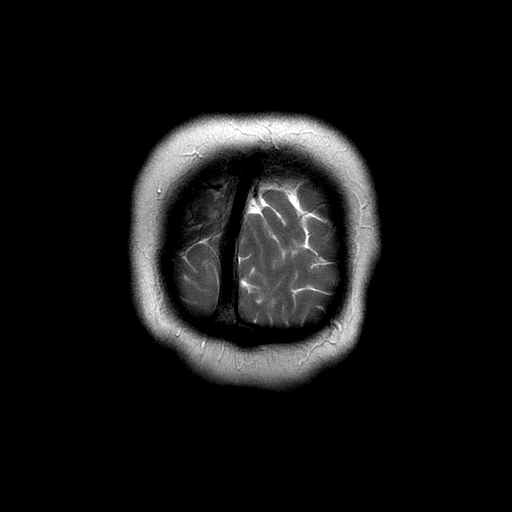

[Series 11: DWI · axial · 2.0mm · 1.09mm/px · z∈[-92,+37]mm · 8 of 135 slices shown (3 of 8)]
[im 1/135]
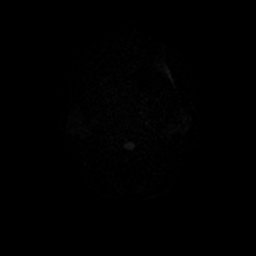
[im 20/135]
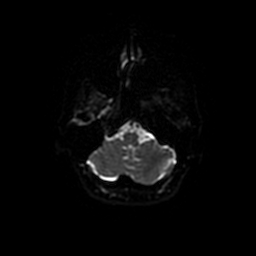
[im 39/135]
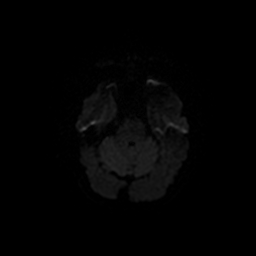
[im 58/135]
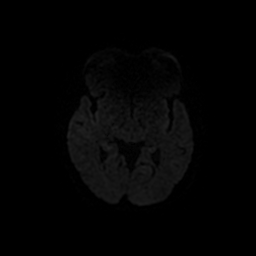
[im 77/135]
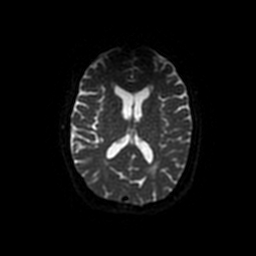
[im 96/135]
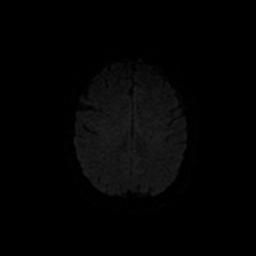
[im 115/135]
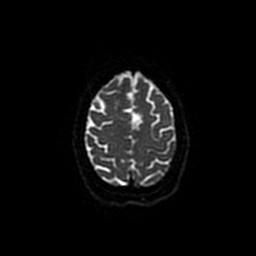
[im 135/135]
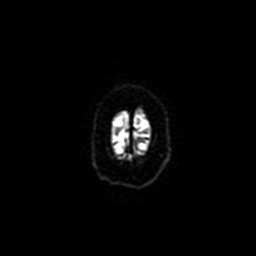

[Series 12: DWI · axial · 2.0mm · 1.09mm/px · z∈[-92,+37]mm · 8 of 136 slices shown (4 of 8)]
[im 1/136]
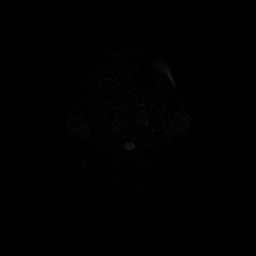
[im 20/136]
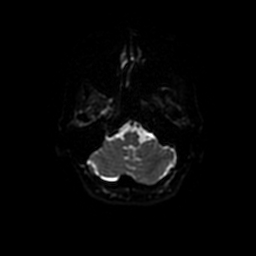
[im 39/136]
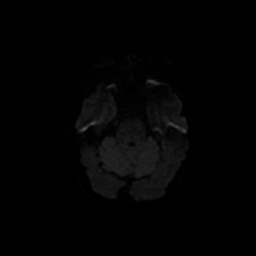
[im 58/136]
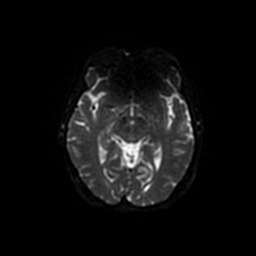
[im 78/136]
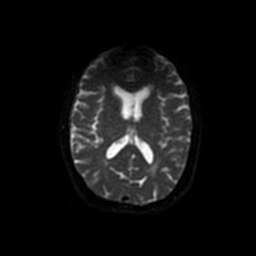
[im 97/136]
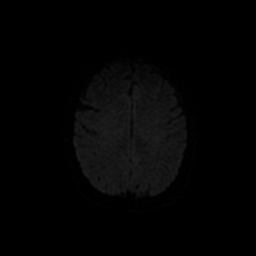
[im 116/136]
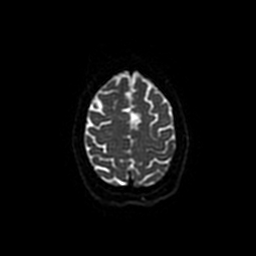
[im 136/136]
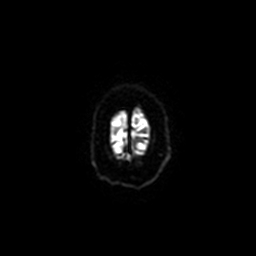

[Series 500: DWI · axial · 5.0mm · 1.09mm/px · z∈[-103,+37]mm · 2 of 30 slices shown (5 of 8)]
[im 1/30]
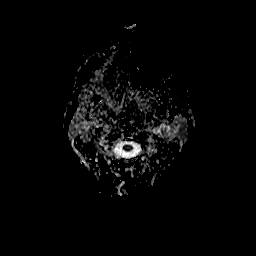
[im 30/30]
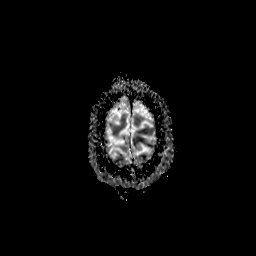

[Series 600: DWI · coronal · 5.0mm · 1.09mm/px · 2 of 33 slices shown (6 of 8)]
[im 1/33]
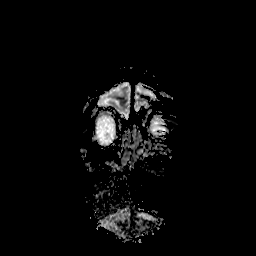
[im 33/33]
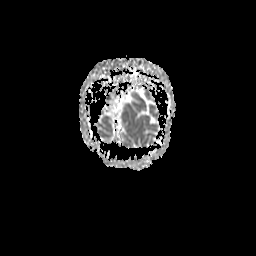

[Series 1100: DWI · axial · 2.0mm · 1.09mm/px · z∈[-92,+37]mm · 4 of 68 slices shown (7 of 8)]
[im 1/68]
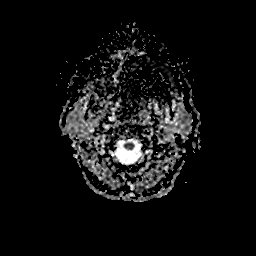
[im 23/68]
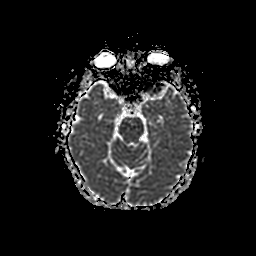
[im 45/68]
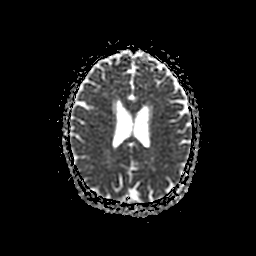
[im 68/68]
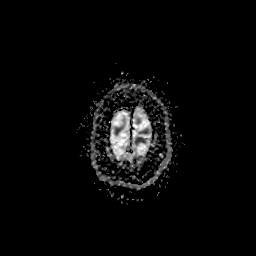

[Series 1200: DWI · axial · 2.0mm · 1.09mm/px · z∈[-92,+37]mm · 4 of 68 slices shown (8 of 8)]
[im 1/68]
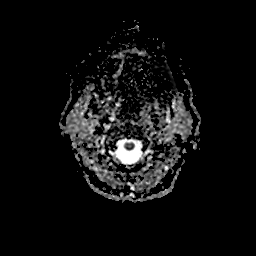
[im 23/68]
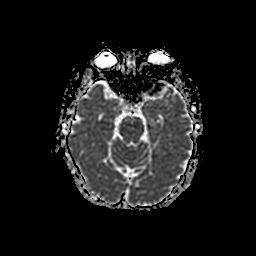
[im 45/68]
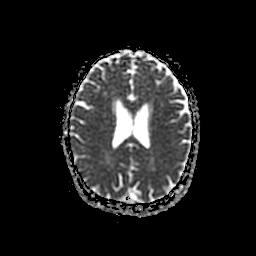
[im 68/68]
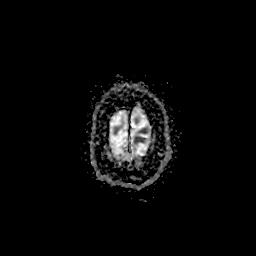

[42 of 48 positions shown; findings below may reference images not displayed]

No restricted diffusion to
suggest acute infarction. No midline shift, mass effect, evidence of
mass lesion, ventriculomegaly, extra-axial collection or acute
intracranial hemorrhage. Cervicomedullary junction and pituitary are
within normal limits. Major intracranial vascular flow voids are
preserved. Asymmetrically tortuous left ICA cavernous segment.

Visible internal auditory structures appear normal. Mild for age
scattered cerebral white matter T2 and FLAIR hyperintensity. The
configuration is nonspecific. No cortical encephalomalacia. Deep
gray matter nuclei, brainstem, and cerebellum are within normal
limits.

Negative visualized cervical spine. Normal bone marrow signal.
Visualized orbit soft tissues are within normal limits. Visualized
paranasal sinuses and mastoids are clear. Visualized scalp soft
tissues are within normal limits.
FINDINGS: 1.  No acute intracranial abnormality.
2. Mild for age nonspecific cerebral white matter signal changes.

## 2016-08-26 ENCOUNTER — Other Ambulatory Visit: Payer: Self-pay | Admitting: Internal Medicine

## 2016-08-26 DIAGNOSIS — Z1231 Encounter for screening mammogram for malignant neoplasm of breast: Secondary | ICD-10-CM

## 2016-10-10 DIAGNOSIS — H2511 Age-related nuclear cataract, right eye: Secondary | ICD-10-CM | POA: Diagnosis not present

## 2016-10-10 DIAGNOSIS — H25811 Combined forms of age-related cataract, right eye: Secondary | ICD-10-CM | POA: Diagnosis not present

## 2016-10-28 ENCOUNTER — Ambulatory Visit
Admission: RE | Admit: 2016-10-28 | Discharge: 2016-10-28 | Disposition: A | Discharge status: Home / Self Care | Payer: Medicare Other | Source: Ambulatory Visit | Attending: Internal Medicine | Admitting: Internal Medicine

## 2016-10-28 DIAGNOSIS — Z1231 Encounter for screening mammogram for malignant neoplasm of breast: Secondary | ICD-10-CM | POA: Diagnosis not present

## 2017-04-21 ENCOUNTER — Telehealth: Payer: Self-pay | Admitting: Obstetrics and Gynecology

## 2017-04-21 NOTE — Telephone Encounter (Signed)
LMTCB/:NP/ .CX/LETTER SENT/RD

## 2017-05-07 DIAGNOSIS — S42294A Other nondisplaced fracture of upper end of right humerus, initial encounter for closed fracture: Secondary | ICD-10-CM | POA: Diagnosis not present

## 2017-05-14 DIAGNOSIS — S42294D Other nondisplaced fracture of upper end of right humerus, subsequent encounter for fracture with routine healing: Secondary | ICD-10-CM | POA: Diagnosis not present

## 2017-06-16 DIAGNOSIS — S42294D Other nondisplaced fracture of upper end of right humerus, subsequent encounter for fracture with routine healing: Secondary | ICD-10-CM | POA: Diagnosis not present

## 2017-06-17 DIAGNOSIS — S42294D Other nondisplaced fracture of upper end of right humerus, subsequent encounter for fracture with routine healing: Secondary | ICD-10-CM | POA: Diagnosis not present

## 2017-06-17 DIAGNOSIS — M25511 Pain in right shoulder: Secondary | ICD-10-CM | POA: Diagnosis not present

## 2017-06-20 DIAGNOSIS — S42294D Other nondisplaced fracture of upper end of right humerus, subsequent encounter for fracture with routine healing: Secondary | ICD-10-CM | POA: Diagnosis not present

## 2017-06-20 DIAGNOSIS — M25511 Pain in right shoulder: Secondary | ICD-10-CM | POA: Diagnosis not present

## 2017-06-23 DIAGNOSIS — S42294D Other nondisplaced fracture of upper end of right humerus, subsequent encounter for fracture with routine healing: Secondary | ICD-10-CM | POA: Diagnosis not present

## 2017-06-23 DIAGNOSIS — M25511 Pain in right shoulder: Secondary | ICD-10-CM | POA: Diagnosis not present

## 2017-06-26 DIAGNOSIS — S42294D Other nondisplaced fracture of upper end of right humerus, subsequent encounter for fracture with routine healing: Secondary | ICD-10-CM | POA: Diagnosis not present

## 2017-06-26 DIAGNOSIS — M25511 Pain in right shoulder: Secondary | ICD-10-CM | POA: Diagnosis not present

## 2017-06-28 DIAGNOSIS — Z23 Encounter for immunization: Secondary | ICD-10-CM | POA: Diagnosis not present

## 2017-06-30 DIAGNOSIS — S42294D Other nondisplaced fracture of upper end of right humerus, subsequent encounter for fracture with routine healing: Secondary | ICD-10-CM | POA: Diagnosis not present

## 2017-06-30 DIAGNOSIS — M25511 Pain in right shoulder: Secondary | ICD-10-CM | POA: Diagnosis not present

## 2017-07-03 DIAGNOSIS — S42294D Other nondisplaced fracture of upper end of right humerus, subsequent encounter for fracture with routine healing: Secondary | ICD-10-CM | POA: Diagnosis not present

## 2017-07-03 DIAGNOSIS — M25511 Pain in right shoulder: Secondary | ICD-10-CM | POA: Diagnosis not present

## 2017-07-10 DIAGNOSIS — S42294D Other nondisplaced fracture of upper end of right humerus, subsequent encounter for fracture with routine healing: Secondary | ICD-10-CM | POA: Diagnosis not present

## 2017-07-10 DIAGNOSIS — M25511 Pain in right shoulder: Secondary | ICD-10-CM | POA: Diagnosis not present

## 2017-07-14 DIAGNOSIS — H401122 Primary open-angle glaucoma, left eye, moderate stage: Secondary | ICD-10-CM | POA: Diagnosis not present

## 2017-07-14 DIAGNOSIS — H401111 Primary open-angle glaucoma, right eye, mild stage: Secondary | ICD-10-CM | POA: Diagnosis not present

## 2017-07-14 DIAGNOSIS — S42294D Other nondisplaced fracture of upper end of right humerus, subsequent encounter for fracture with routine healing: Secondary | ICD-10-CM | POA: Diagnosis not present

## 2017-07-15 DIAGNOSIS — M25511 Pain in right shoulder: Secondary | ICD-10-CM | POA: Diagnosis not present

## 2017-07-15 DIAGNOSIS — S42294D Other nondisplaced fracture of upper end of right humerus, subsequent encounter for fracture with routine healing: Secondary | ICD-10-CM | POA: Diagnosis not present

## 2017-07-17 DIAGNOSIS — S42294D Other nondisplaced fracture of upper end of right humerus, subsequent encounter for fracture with routine healing: Secondary | ICD-10-CM | POA: Diagnosis not present

## 2017-07-17 DIAGNOSIS — M25511 Pain in right shoulder: Secondary | ICD-10-CM | POA: Diagnosis not present

## 2017-07-21 DIAGNOSIS — M25511 Pain in right shoulder: Secondary | ICD-10-CM | POA: Diagnosis not present

## 2017-07-21 DIAGNOSIS — S42294D Other nondisplaced fracture of upper end of right humerus, subsequent encounter for fracture with routine healing: Secondary | ICD-10-CM | POA: Diagnosis not present

## 2017-07-24 DIAGNOSIS — M25511 Pain in right shoulder: Secondary | ICD-10-CM | POA: Diagnosis not present

## 2017-07-24 DIAGNOSIS — S42294D Other nondisplaced fracture of upper end of right humerus, subsequent encounter for fracture with routine healing: Secondary | ICD-10-CM | POA: Diagnosis not present

## 2017-07-29 DIAGNOSIS — S42294D Other nondisplaced fracture of upper end of right humerus, subsequent encounter for fracture with routine healing: Secondary | ICD-10-CM | POA: Diagnosis not present

## 2017-07-29 DIAGNOSIS — M25511 Pain in right shoulder: Secondary | ICD-10-CM | POA: Diagnosis not present

## 2017-08-04 ENCOUNTER — Ambulatory Visit: Payer: Medicare Other | Admitting: Nurse Practitioner

## 2017-08-05 DIAGNOSIS — M25511 Pain in right shoulder: Secondary | ICD-10-CM | POA: Diagnosis not present

## 2017-08-05 DIAGNOSIS — S42294D Other nondisplaced fracture of upper end of right humerus, subsequent encounter for fracture with routine healing: Secondary | ICD-10-CM | POA: Diagnosis not present

## 2017-08-11 DIAGNOSIS — M25511 Pain in right shoulder: Secondary | ICD-10-CM | POA: Diagnosis not present

## 2017-08-11 DIAGNOSIS — S42294D Other nondisplaced fracture of upper end of right humerus, subsequent encounter for fracture with routine healing: Secondary | ICD-10-CM | POA: Diagnosis not present

## 2017-08-13 DIAGNOSIS — S42294D Other nondisplaced fracture of upper end of right humerus, subsequent encounter for fracture with routine healing: Secondary | ICD-10-CM | POA: Diagnosis not present

## 2017-08-15 DIAGNOSIS — R1011 Right upper quadrant pain: Secondary | ICD-10-CM | POA: Diagnosis not present

## 2017-08-15 DIAGNOSIS — Z6827 Body mass index (BMI) 27.0-27.9, adult: Secondary | ICD-10-CM | POA: Diagnosis not present

## 2017-08-15 DIAGNOSIS — K219 Gastro-esophageal reflux disease without esophagitis: Secondary | ICD-10-CM | POA: Diagnosis not present

## 2017-08-15 DIAGNOSIS — R0989 Other specified symptoms and signs involving the circulatory and respiratory systems: Secondary | ICD-10-CM | POA: Diagnosis not present

## 2017-08-15 DIAGNOSIS — R110 Nausea: Secondary | ICD-10-CM | POA: Diagnosis not present

## 2017-08-18 DIAGNOSIS — I10 Essential (primary) hypertension: Secondary | ICD-10-CM | POA: Diagnosis not present

## 2017-08-18 DIAGNOSIS — M859 Disorder of bone density and structure, unspecified: Secondary | ICD-10-CM | POA: Diagnosis not present

## 2017-08-18 DIAGNOSIS — R82998 Other abnormal findings in urine: Secondary | ICD-10-CM | POA: Diagnosis not present

## 2017-08-18 DIAGNOSIS — E7849 Other hyperlipidemia: Secondary | ICD-10-CM | POA: Diagnosis not present

## 2017-08-25 DIAGNOSIS — M7611 Psoas tendinitis, right hip: Secondary | ICD-10-CM | POA: Diagnosis not present

## 2017-08-25 DIAGNOSIS — M1380 Other specified arthritis, unspecified site: Secondary | ICD-10-CM | POA: Diagnosis not present

## 2017-08-25 DIAGNOSIS — R808 Other proteinuria: Secondary | ICD-10-CM | POA: Diagnosis not present

## 2017-08-25 DIAGNOSIS — R1011 Right upper quadrant pain: Secondary | ICD-10-CM | POA: Diagnosis not present

## 2017-08-25 DIAGNOSIS — Z1389 Encounter for screening for other disorder: Secondary | ICD-10-CM | POA: Diagnosis not present

## 2017-08-25 DIAGNOSIS — I7389 Other specified peripheral vascular diseases: Secondary | ICD-10-CM | POA: Diagnosis not present

## 2017-08-25 DIAGNOSIS — Z0000 Encounter for general adult medical examination without abnormal findings: Secondary | ICD-10-CM | POA: Diagnosis not present

## 2017-08-25 DIAGNOSIS — R110 Nausea: Secondary | ICD-10-CM | POA: Diagnosis not present

## 2017-08-25 DIAGNOSIS — Z6827 Body mass index (BMI) 27.0-27.9, adult: Secondary | ICD-10-CM | POA: Diagnosis not present

## 2017-08-25 DIAGNOSIS — R0989 Other specified symptoms and signs involving the circulatory and respiratory systems: Secondary | ICD-10-CM | POA: Diagnosis not present

## 2017-08-25 DIAGNOSIS — E7849 Other hyperlipidemia: Secondary | ICD-10-CM | POA: Diagnosis not present

## 2017-08-25 DIAGNOSIS — D126 Benign neoplasm of colon, unspecified: Secondary | ICD-10-CM | POA: Diagnosis not present

## 2017-10-15 DIAGNOSIS — J0100 Acute maxillary sinusitis, unspecified: Secondary | ICD-10-CM | POA: Diagnosis not present

## 2017-10-28 ENCOUNTER — Other Ambulatory Visit: Payer: Self-pay | Admitting: Internal Medicine

## 2017-10-28 DIAGNOSIS — Z1231 Encounter for screening mammogram for malignant neoplasm of breast: Secondary | ICD-10-CM

## 2017-11-21 DIAGNOSIS — S42201D Unspecified fracture of upper end of right humerus, subsequent encounter for fracture with routine healing: Secondary | ICD-10-CM | POA: Diagnosis not present

## 2017-11-21 DIAGNOSIS — M25511 Pain in right shoulder: Secondary | ICD-10-CM | POA: Diagnosis not present

## 2017-11-25 ENCOUNTER — Ambulatory Visit
Admission: RE | Admit: 2017-11-25 | Discharge: 2017-11-25 | Disposition: A | Discharge status: Home / Self Care | Payer: Medicare Other | Source: Ambulatory Visit | Attending: Internal Medicine | Admitting: Internal Medicine

## 2017-11-25 DIAGNOSIS — Z1231 Encounter for screening mammogram for malignant neoplasm of breast: Secondary | ICD-10-CM

## 2017-12-18 DIAGNOSIS — D126 Benign neoplasm of colon, unspecified: Secondary | ICD-10-CM | POA: Diagnosis not present

## 2017-12-18 DIAGNOSIS — K5730 Diverticulosis of large intestine without perforation or abscess without bleeding: Secondary | ICD-10-CM | POA: Diagnosis not present

## 2017-12-18 DIAGNOSIS — Z86010 Personal history of colonic polyps: Secondary | ICD-10-CM | POA: Diagnosis not present

## 2017-12-23 DIAGNOSIS — D126 Benign neoplasm of colon, unspecified: Secondary | ICD-10-CM | POA: Diagnosis not present

## 2018-01-13 DIAGNOSIS — H401111 Primary open-angle glaucoma, right eye, mild stage: Secondary | ICD-10-CM | POA: Diagnosis not present

## 2018-01-13 DIAGNOSIS — H401122 Primary open-angle glaucoma, left eye, moderate stage: Secondary | ICD-10-CM | POA: Diagnosis not present

## 2018-04-13 DIAGNOSIS — M25511 Pain in right shoulder: Secondary | ICD-10-CM | POA: Diagnosis not present

## 2018-05-30 DIAGNOSIS — S239XXA Sprain of unspecified parts of thorax, initial encounter: Secondary | ICD-10-CM | POA: Diagnosis not present

## 2018-05-30 DIAGNOSIS — S335XXA Sprain of ligaments of lumbar spine, initial encounter: Secondary | ICD-10-CM | POA: Diagnosis not present

## 2018-05-30 DIAGNOSIS — M545 Low back pain: Secondary | ICD-10-CM | POA: Diagnosis not present

## 2018-06-04 DIAGNOSIS — Z6825 Body mass index (BMI) 25.0-25.9, adult: Secondary | ICD-10-CM | POA: Diagnosis not present

## 2018-06-04 DIAGNOSIS — R300 Dysuria: Secondary | ICD-10-CM | POA: Diagnosis not present

## 2018-06-04 DIAGNOSIS — N8110 Cystocele, unspecified: Secondary | ICD-10-CM | POA: Diagnosis not present

## 2018-06-04 DIAGNOSIS — M545 Low back pain: Secondary | ICD-10-CM | POA: Diagnosis not present

## 2018-06-04 DIAGNOSIS — Z23 Encounter for immunization: Secondary | ICD-10-CM | POA: Diagnosis not present

## 2018-06-08 DIAGNOSIS — M5416 Radiculopathy, lumbar region: Secondary | ICD-10-CM | POA: Diagnosis not present

## 2018-06-10 ENCOUNTER — Telehealth: Payer: Self-pay | Admitting: Obstetrics and Gynecology

## 2018-06-10 DIAGNOSIS — M545 Low back pain: Secondary | ICD-10-CM | POA: Diagnosis not present

## 2018-06-10 DIAGNOSIS — M5416 Radiculopathy, lumbar region: Secondary | ICD-10-CM | POA: Diagnosis not present

## 2018-06-10 NOTE — Telephone Encounter (Signed)
Called and left a message for patient to call back to schedule an appointment for prolapse. She is an existing patient with our office and was last seen on 07/08/16.

## 2018-06-11 NOTE — Telephone Encounter (Signed)
Called and left a message for patient to call back to schedule an appointment for prolapse. She is an existing patient with our office and was last seen on 07/08/16.

## 2018-06-15 ENCOUNTER — Encounter: Payer: Self-pay | Admitting: Obstetrics & Gynecology

## 2018-06-15 ENCOUNTER — Ambulatory Visit (INDEPENDENT_AMBULATORY_CARE_PROVIDER_SITE_OTHER): Payer: Medicare Other | Admitting: Obstetrics & Gynecology

## 2018-06-15 VITALS — BP 116/80 | HR 78 | Resp 16 | Ht 63.25 in | Wt 146.0 lb

## 2018-06-15 DIAGNOSIS — N8110 Cystocele, unspecified: Secondary | ICD-10-CM | POA: Insufficient documentation

## 2018-06-15 LAB — POCT URINALYSIS DIPSTICK
Bilirubin, UA: NEGATIVE
Blood, UA: NEGATIVE
Glucose, UA: NEGATIVE
Ketones, UA: NEGATIVE
LEUKOCYTES UA: NEGATIVE
Nitrite, UA: NEGATIVE
PH UA: 5 (ref 5.0–8.0)
PROTEIN UA: NEGATIVE
Urobilinogen, UA: 0.2 E.U./dL

## 2018-06-15 NOTE — Addendum Note (Signed)
Addended by: Polly Cobia on: 06/15/2018 01:19 PM   Modules accepted: Orders

## 2018-06-15 NOTE — Progress Notes (Signed)
GYNECOLOGY  VISIT  CC:   prolapse  HPI: 72 y.o. G77P2012 Married White or Caucasian female here for bladder prolapse that she feels has been present for about a few weeks.  This started this summer.  She's been having some issue with feeling like she cannot completely empty her bladder.  This is easier in the morning when she first gets up.  Does occasionally have nocturia.  She does have some urinary incontinence with laughing or coughing.  Denies vaginal bleeding or discharge.  Does actually feel prolapse at times.    Last pap smear 2015 was negative.    Currently doing physical therapy for her back.  Going to [place].  Will hopefully finish on Wednesday when has next appt.  GYNECOLOGIC HISTORY: Patient's last menstrual period was 09/30/1993. Contraception: post menopausal  Menopausal hormone therapy: none  Patient Active Problem List   Diagnosis Date Noted  . Vertigo 05/07/2014  . Hyperglycemia 05/07/2014  . GERD (gastroesophageal reflux disease) 05/07/2014  . Glaucoma 05/07/2014  . Osteoarthritis   . Bruit 07/04/2011  . HTN (hypertension) 07/04/2011  . Mixed hyperlipidemia 07/04/2011  . 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  . Palpitations 07/04/2011    Past Medical History:  Diagnosis Date  . Arthritis   . Carotid bruit   . Elevated lipids   . GERD (gastroesophageal reflux disease)   . Hypertension   . Osteoarthritis   . RBBB (right bundle branch block)   . Symptomatic PVCs     Past Surgical History:  Procedure Laterality Date  . BREAST BIOPSY Left 3/97  . OOPHORECTOMY Left 1969   secondary to dermoid  . REFRACTIVE SURGERY Left 2014  . ROTATOR CUFF REPAIR Right 09/2002    MEDS:   Current Outpatient Medications on File Prior to Visit  Medication Sig Dispense Refill  . aspirin 81 MG tablet Take 81 mg by mouth daily.      Marland Kitchen latanoprost (XALATAN) 0.005 % ophthalmic solution Place 1 drop into both eyes at bedtime.       . methocarbamol (ROBAXIN) 500 MG tablet Take 2 tablets by mouth 4 (four) times daily as needed.    . Multiple Vitamin (MULTIVITAMIN) capsule Take 1 capsule by mouth daily.      . multivitamin-lutein (OCUVITE-LUTEIN) CAPS capsule Take 1 capsule by mouth daily.    . simvastatin (ZOCOR) 20 MG tablet Take 20 mg by mouth every evening.  3  . timolol (BETIMOL) 0.5 % ophthalmic solution daily.     No current facility-administered medications on file prior to visit.     ALLERGIES: Erythromycin; Penicillins; and Sulfa antibiotics  Family History  Problem Relation Age of Onset  . Cancer Mother 54       throat  . Hypertension Father   . Heart disease Father 72  . Prostate cancer Brother 26  . Hypertension Brother   . Liver cancer Maternal Aunt   . Stomach cancer Maternal Grandfather     SH:  Married, non smoker  Review of Systems  Genitourinary: Positive for urgency.       Loss of urine with sneeze or cough  Night urination   Musculoskeletal: Positive for myalgias.  All other systems reviewed and are negative.   PHYSICAL EXAMINATION:    BP 116/80 (BP Location: Right Arm, Patient Position: Sitting, Cuff Size: Large)   Pulse 78   Resp 16   Ht 5' 3.25" (1.607 m)   Wt 146 lb (66.2 kg)  LMP 09/30/1993   BMI 25.66 kg/m     General appearance: alert, cooperative and appears stated age Abdomen: soft, non-tender; bowel sounds normal; no masses,  no organomegaly Lymph:  no inguinal LAD noted  Pelvic: External genitalia:  no lesions              Urethra:  normal appearing urethra with no masses, tenderness or lesions              Bartholins and Skenes: normal                 Vagina: atrophic changes, no masses, no discharge, 3rd degree cystocele noted with Valsalva maneuver              Cervix: no lesions              Bimanual Exam:  Uterus:  normal size, contour, position, consistency, mobility, non-tender              Adnexa: no mass, fullness, tenderness   Chaperone was present  for exam.  Assessment: 3rd degree cystocele Mild SUI Incomplete bladder emptying  Plan: Surgical correction, pessary use and pelvic PT discussed.  Not interested in surgery at this time.  Would consider PT--aware would need different physical therapy for this.  Would come to [facility] for this but I will check with [facility] to see if they offer pelvic PT services.  Does not desire a pessary at this time.  Information on pelvic relaxation given to pt today.  She will let me know on Wednesday if she is released from the back PT she is currently doing.   ~15 minutes spent with patient >50% of time was in face to face discussion of above.

## 2018-06-17 DIAGNOSIS — M545 Low back pain: Secondary | ICD-10-CM | POA: Diagnosis not present

## 2018-06-17 DIAGNOSIS — M5416 Radiculopathy, lumbar region: Secondary | ICD-10-CM | POA: Diagnosis not present

## 2018-07-02 DIAGNOSIS — M5136 Other intervertebral disc degeneration, lumbar region: Secondary | ICD-10-CM | POA: Diagnosis not present

## 2018-07-21 DIAGNOSIS — Z961 Presence of intraocular lens: Secondary | ICD-10-CM | POA: Diagnosis not present

## 2018-07-21 DIAGNOSIS — H524 Presbyopia: Secondary | ICD-10-CM | POA: Diagnosis not present

## 2018-07-21 DIAGNOSIS — H401131 Primary open-angle glaucoma, bilateral, mild stage: Secondary | ICD-10-CM | POA: Diagnosis not present

## 2018-08-24 DIAGNOSIS — M859 Disorder of bone density and structure, unspecified: Secondary | ICD-10-CM | POA: Diagnosis not present

## 2018-08-24 DIAGNOSIS — E7849 Other hyperlipidemia: Secondary | ICD-10-CM | POA: Diagnosis not present

## 2018-08-24 DIAGNOSIS — I10 Essential (primary) hypertension: Secondary | ICD-10-CM | POA: Diagnosis not present

## 2018-08-24 DIAGNOSIS — R82998 Other abnormal findings in urine: Secondary | ICD-10-CM | POA: Diagnosis not present

## 2018-09-04 DIAGNOSIS — I7389 Other specified peripheral vascular diseases: Secondary | ICD-10-CM | POA: Diagnosis not present

## 2018-09-04 DIAGNOSIS — E7849 Other hyperlipidemia: Secondary | ICD-10-CM | POA: Diagnosis not present

## 2018-09-04 DIAGNOSIS — D126 Benign neoplasm of colon, unspecified: Secondary | ICD-10-CM | POA: Diagnosis not present

## 2018-09-04 DIAGNOSIS — R0989 Other specified symptoms and signs involving the circulatory and respiratory systems: Secondary | ICD-10-CM | POA: Diagnosis not present

## 2018-09-04 DIAGNOSIS — Z1389 Encounter for screening for other disorder: Secondary | ICD-10-CM | POA: Diagnosis not present

## 2018-09-04 DIAGNOSIS — I6529 Occlusion and stenosis of unspecified carotid artery: Secondary | ICD-10-CM | POA: Diagnosis not present

## 2018-09-04 DIAGNOSIS — R42 Dizziness and giddiness: Secondary | ICD-10-CM | POA: Diagnosis not present

## 2018-09-04 DIAGNOSIS — R413 Other amnesia: Secondary | ICD-10-CM | POA: Diagnosis not present

## 2018-09-04 DIAGNOSIS — Z0000 Encounter for general adult medical examination without abnormal findings: Secondary | ICD-10-CM | POA: Diagnosis not present

## 2018-09-04 DIAGNOSIS — Z6826 Body mass index (BMI) 26.0-26.9, adult: Secondary | ICD-10-CM | POA: Diagnosis not present

## 2018-09-04 DIAGNOSIS — M7611 Psoas tendinitis, right hip: Secondary | ICD-10-CM | POA: Diagnosis not present

## 2018-09-04 DIAGNOSIS — N8110 Cystocele, unspecified: Secondary | ICD-10-CM | POA: Diagnosis not present

## 2018-09-08 DIAGNOSIS — Z1212 Encounter for screening for malignant neoplasm of rectum: Secondary | ICD-10-CM | POA: Diagnosis not present

## 2018-09-13 ENCOUNTER — Other Ambulatory Visit (HOSPITAL_COMMUNITY): Payer: Medicare Other

## 2018-09-13 ENCOUNTER — Emergency Department (HOSPITAL_COMMUNITY): Payer: Medicare Other

## 2018-09-13 ENCOUNTER — Other Ambulatory Visit: Payer: Self-pay

## 2018-09-13 ENCOUNTER — Emergency Department (HOSPITAL_COMMUNITY)
Admission: EM | Admit: 2018-09-13 | Discharge: 2018-09-13 | Disposition: A | Discharge status: Home / Self Care | Payer: Medicare Other | Attending: Emergency Medicine | Admitting: Emergency Medicine

## 2018-09-13 DIAGNOSIS — Z87891 Personal history of nicotine dependence: Secondary | ICD-10-CM | POA: Insufficient documentation

## 2018-09-13 DIAGNOSIS — R110 Nausea: Secondary | ICD-10-CM | POA: Diagnosis not present

## 2018-09-13 DIAGNOSIS — Z79899 Other long term (current) drug therapy: Secondary | ICD-10-CM | POA: Insufficient documentation

## 2018-09-13 DIAGNOSIS — Z7982 Long term (current) use of aspirin: Secondary | ICD-10-CM | POA: Diagnosis not present

## 2018-09-13 DIAGNOSIS — R202 Paresthesia of skin: Secondary | ICD-10-CM | POA: Diagnosis not present

## 2018-09-13 DIAGNOSIS — R42 Dizziness and giddiness: Secondary | ICD-10-CM | POA: Diagnosis not present

## 2018-09-13 DIAGNOSIS — I10 Essential (primary) hypertension: Secondary | ICD-10-CM | POA: Insufficient documentation

## 2018-09-13 LAB — CBC WITH DIFFERENTIAL/PLATELET
Abs Immature Granulocytes: 0.01 10*3/uL (ref 0.00–0.07)
BASOS ABS: 0.1 10*3/uL (ref 0.0–0.1)
Basophils Relative: 1 %
Eosinophils Absolute: 0.1 10*3/uL (ref 0.0–0.5)
Eosinophils Relative: 2 %
HCT: 45.3 % (ref 36.0–46.0)
HEMOGLOBIN: 14.4 g/dL (ref 12.0–15.0)
IMMATURE GRANULOCYTES: 0 %
LYMPHS PCT: 12 %
Lymphs Abs: 1 10*3/uL (ref 0.7–4.0)
MCH: 30.1 pg (ref 26.0–34.0)
MCHC: 31.8 g/dL (ref 30.0–36.0)
MCV: 94.6 fL (ref 80.0–100.0)
MONOS PCT: 5 %
Monocytes Absolute: 0.4 10*3/uL (ref 0.1–1.0)
NEUTROS PCT: 80 %
Neutro Abs: 6.8 10*3/uL (ref 1.7–7.7)
Platelets: 294 10*3/uL (ref 150–400)
RBC: 4.79 MIL/uL (ref 3.87–5.11)
RDW: 12.2 % (ref 11.5–15.5)
WBC: 8.5 10*3/uL (ref 4.0–10.5)
nRBC: 0 % (ref 0.0–0.2)

## 2018-09-13 LAB — COMPREHENSIVE METABOLIC PANEL
ALBUMIN: 4 g/dL (ref 3.5–5.0)
ALK PHOS: 53 U/L (ref 38–126)
ALT: 16 U/L (ref 0–44)
AST: 23 U/L (ref 15–41)
Anion gap: 12 (ref 5–15)
BILIRUBIN TOTAL: 0.7 mg/dL (ref 0.3–1.2)
BUN: 18 mg/dL (ref 8–23)
CALCIUM: 9.7 mg/dL (ref 8.9–10.3)
CO2: 24 mmol/L (ref 22–32)
Chloride: 100 mmol/L (ref 98–111)
Creatinine, Ser: 0.83 mg/dL (ref 0.44–1.00)
GFR calc Af Amer: >60 mL/min (ref >60)
GFR calc non Af Amer: >60 mL/min (ref >60)
Glucose, Bld: 147 mg/dL — ABNORMAL HIGH (ref 70–99)
Potassium: 4.1 mmol/L (ref 3.5–5.1)
Sodium: 136 mmol/L (ref 135–145)
TOTAL PROTEIN: 7.5 g/dL (ref 6.5–8.1)

## 2018-09-13 MED ORDER — MECLIZINE HCL 12.5 MG PO TABS: 12.5000 mg | ORAL_TABLET | Freq: Three times a day (TID) | ORAL | 0 refills | Status: DC | PRN

## 2018-09-13 MED ORDER — GADOBUTROL 1 MMOL/ML IV SOLN
6.0000 mL | Freq: Once | INTRAVENOUS | Status: AC | PRN
  Administered 2018-09-13: 6 mL via INTRAVENOUS

## 2018-09-13 MED ORDER — ONDANSETRON 4 MG PO TBDP
ORAL_TABLET | ORAL | Status: AC
  Administered 2018-09-13: 4 mg
  Filled 2018-09-13: qty 1

## 2018-09-13 MED ORDER — IOPAMIDOL (ISOVUE-370) INJECTION 76%
100.0000 mL | Freq: Once | INTRAVENOUS | Status: AC | PRN
  Administered 2018-09-13: 100 mL via INTRAVENOUS

## 2018-09-13 MED ORDER — IOPAMIDOL (ISOVUE-370) INJECTION 76%
INTRAVENOUS | Status: AC
  Filled 2018-09-13: qty 100

## 2018-09-13 NOTE — ED Notes (Signed)
Patient transported to MRI 

## 2018-09-13 NOTE — Discharge Instructions (Addendum)
It was our pleasure to provide your ER care today - we hope that you feel better.  Your imaging was read by your radiologist and neurologist as showing no acute stroke - see report below. 1. Negative for large vessel occlusion or significant arterial stenosis in the head or neck. Atherosclerosis is most pronounced in both ICA siphons. 2. 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 3. 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 4. No acute intracranial abnormality.  Our neurologist indicates for you to take an aspirin a day, and to follow up with neurologist as outpatient - call office tomorrow to arrange follow up appointment.   You may take antivert as need for dizziness - no driving if/when feeling dizzy.  Return to ER if worse, new symptoms, change in speech/vision, numbness/weakness, other concern.

## 2018-09-13 NOTE — ED Triage Notes (Signed)
Pt to ED for evaluation of vertigo onset yesterday worsening today with hx of same 2 years ago. Endorses numbness in right face but nowhere else. Does not feel dizzy at rest, only with movement. Has tried maneuvers given by PT to help without relief. Feels like if she moves too fast she gets dizzy and nauseas. Reports her BP was high this morning.

## 2018-09-13 NOTE — ED Provider Notes (Signed)
[facility] EMERGENCY DEPARTMENT Provider Note   CSN: [id number] Arrival date & time: 09/13/18  1207     History   Chief Complaint No chief complaint on file.   HPI Asani C Hutto is a 72 y.o. female.  HPI  72 y/o female - she has a hx of vertigo - seen in 2015 - has had an MRI for vertigo - was negative - spent a few days in the hospital at that time due to severity of sx. the patient reports that last night and yesterday she was not feeling herself, felt like she might be getting vertiginous again but it did not actually had until this morning when she tried to get out of bed to use the bathroom.  She felt like she could not walk straight, she was leaning to the right and then felt acute onset of a dizziness described as a vertiginous feeling.  She became extremely nauseated and had a feeling of being diaphoretic and cool and clammy, her husband who was there with her corroborates the patient's story and states that she was feeling very ill hence their visit to the hospital.  The patient reports that this definitely gets worse when she tries to bend over or moves her head from side to side, at home prior to arrival she laid on the edge of the bed and her husband tried to perform different maneuvers such as turning her head from side to side however this only made her symptoms worse.  She does endorse having a slight right-sided facial numbness, she does not usually have this and has no other numbness or weakness to her body.  No changes in speech, no changes in vision.  When she is sitting perfectly still in the upright position she has less symptoms and it significantly improves.  No chest pain, no shortness of breath, no fevers chills or diarrhea, her family doctor has recently ordered a ultrasound of her carotid artery secondary to what he thought was a bruit on the right side.  Past Medical History:  Diagnosis Date  . Arthritis   . Carotid bruit   . Elevated lipids    . GERD (gastroesophageal reflux disease)   . Hypertension   . Osteoarthritis   . RBBB (right bundle branch block)   . Symptomatic PVCs     Patient Active Problem List   Diagnosis Date Noted  . 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 Vertigo 05/07/2014  . Hyperglycemia 05/07/2014  . GERD (gastroesophageal reflux disease) 05/07/2014  . Glaucoma 05/07/2014  . Osteoarthritis   . Bruit 07/04/2011  . HTN (hypertension) 07/04/2011  . Mixed hyperlipidemia 07/04/2011  . 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  . Palpitations 07/04/2011    Past Surgical History:  Procedure Laterality Date  . CATARACT EXTRACTION Bilateral 2014   done 3 weeks apart  . OOPHORECTOMY Left 1969   secondary to dermoid  . ROTATOR CUFF REPAIR Right 09/2002     OB History    Gravida  3   Para  2   Term  2   Preterm      AB  1   Living  2     SAB      TAB      Ectopic      Multiple      Live Births  2            Home Medications    Prior to Admission  medications   Medication Sig Start Date End Date Taking? Authorizing Provider  aspirin 81 MG tablet Take 81 mg by mouth daily.      [provider]  latanoprost (XALATAN) 0.005 % ophthalmic solution Place 1 drop into both eyes at bedtime.      [provider]  methocarbamol (ROBAXIN) 500 MG tablet Take 2 tablets by mouth 4 (four) times daily as needed.    [provider]  Multiple Vitamin (MULTIVITAMIN) capsule Take 1 capsule by mouth daily.      [provider]  multivitamin-lutein (OCUVITE-LUTEIN) CAPS capsule Take 1 capsule by mouth daily.    [provider]  simvastatin (ZOCOR) 20 MG tablet Take 20 mg by mouth every evening. 06/06/18   [provider]  timolol (BETIMOL) 0.5 % ophthalmic solution daily.    [provider]    Family History Family History  Problem Relation Age of Onset  . Cancer Mother 33       throat  .  Hypertension Father   . Heart disease Father 76  . Prostate cancer Brother 50  . Hypertension Brother   . Liver cancer Maternal Aunt   . Stomach cancer Maternal Grandfather     Social History Social History   Tobacco Use  . Smoking status: Former Smoker    Packs/day: 1.00    Years: 20.00    Pack years: 20.00    Last attempt to quit: 10/01/1983    Years since quitting: 34.9  . Smokeless tobacco: Never Used  Substance Use Topics  . Alcohol use: Yes    Alcohol/week: 1.0 - 2.0 standard drinks    Types: 1 - 2 Standard drinks or equivalent per week    Comment: occassionally wine  . Drug use: No     Allergies   Erythromycin; Penicillins; and Sulfa antibiotics   Review of Systems Review of Systems  Constitutional: Negative for fever.  Neurological: Positive for numbness. Negative for weakness and headaches.     Physical Exam Updated Vital Signs BP (!) 147/74   Pulse 82   Temp 97.7 F (36.5 C) (Axillary)   Resp 15   LMP 09/30/1993   SpO2 99%   Physical Exam Vitals signs and nursing note reviewed.  Constitutional:      General: She is not in acute distress.    Appearance: She is well-developed.  HENT:     Head: Normocephalic and atraumatic.     Comments: Tympanic membrane visualized bilaterally, no external auditory canal swelling or rash    Mouth/Throat:     Pharynx: No oropharyngeal exudate.  Eyes:     General: No scleral icterus.       Right eye: No discharge.        Left eye: No discharge.     Conjunctiva/sclera: Conjunctivae normal.     Pupils: Pupils are equal, round, and reactive to light.  Neck:     Musculoskeletal: Normal range of motion and neck supple.     Thyroid: No thyromegaly.     Vascular: No JVD.  Cardiovascular:     Rate and Rhythm: Normal rate and regular rhythm.     Heart sounds: Normal heart sounds. No murmur. No friction rub. No gallop.   Pulmonary:     Effort: Pulmonary effort is normal. No respiratory distress.     Breath sounds:  Normal breath sounds. No wheezing or rales.  Abdominal:     General: Bowel sounds are normal. There is no distension.  Palpations: Abdomen is soft. There is no mass.     Tenderness: There is no abdominal tenderness.  Musculoskeletal: Normal range of motion.        General: No tenderness.  Lymphadenopathy:     Cervical: No cervical adenopathy.  Skin:    General: Skin is warm and dry.     Findings: No erythema or rash.  Neurological:     Mental Status: She is alert.     Coordination: Coordination normal.     Comments: Normal strength in all 4 extremities, normal finger-nose-finger, normal heel shin, no pronator drift, speech is clear, goal-directed, cranial nerves III through XII are totally normal.  When the patient rotates her head to the left or the right she has what appears to be a slight nystagmus, she has decreased sensation to the right side of the face but no other lack of sensation.  No facial droop  Psychiatric:        Behavior: Behavior normal.      ED Treatments / Results  Labs (all labs ordered are listed, but only abnormal results are displayed) Labs Reviewed  COMPREHENSIVE METABOLIC PANEL - Abnormal; Notable for the following components:      Result Value   Glucose, Bld 147 (*)    All other components within normal limits  CBC WITH DIFFERENTIAL/PLATELET    EKG EKG Interpretation  Date/Time:  Sunday September 13 2018 12:25:11 EST Ventricular Rate:  92 PR Interval:    QRS Duration: 117 QT Interval:  388 QTC Calculation: 480 R Axis:   75 Text Interpretation:  Sinus rhythm Biatrial enlargement Incomplete right bundle branch block since last tracing no significant change Confirmed by Noemi Chapel [phone]) on 09/13/2018 2:06:06 PM   Radiology No results found.  Procedures Procedures (including critical care time)  Medications Ordered in ED Medications  ondansetron (ZOFRAN-ODT) 4 MG disintegrating tablet (4 mg  Given 09/13/18 1231)  iopamidol  (ISOVUE-370) 76 % injection 100 mL (100 mLs Intravenous Contrast Given 09/13/18 1454)     Initial Impression / Assessment and Plan / ED Course  I have reviewed the triage vital signs and the nursing notes.  Pertinent labs & imaging results that were available during my care of the patient were reviewed by me and considered in my medical decision making (see chart for details).    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 Cheral Marker who recommends a CT angiogram of the head and the neck as well as an MR of the brain with contrast, he request formal consultation with him after the studies are back.  Patient will be given Zofran  Change of shift - care signed out to Dr. Ashok Cordia who will follow up MR results, discuss with Neuro and disposition accordingly.  Final Clinical Impressions(s) / ED Diagnoses   Final diagnoses:  None    ED Discharge Orders    None       Noemi Chapel, MD 09/13/18 1537

## 2018-09-13 NOTE — ED Provider Notes (Signed)
Signed out by Dr Sabra Heck to call neurology when imaging done.  Imaging complete - neg for cva per radiology.   Dr Cheral Marker has reviewed imaging - rec patient continue asa q day, and f/u neurology as outpt.  Pt currently reports feeling improved. Dizziness improved. No nv. No numbness/weakness.     Lajean Saver, MD 09/13/18 [phone]

## 2018-09-13 NOTE — ED Notes (Signed)
Patient transported to CT 

## 2018-09-13 NOTE — ED Notes (Signed)
Patient verbalizes understanding of discharge instructions. Opportunity for questioning and answers were provided. Armband removed by staff, pt discharged from ED ambulatory.   

## 2018-09-16 ENCOUNTER — Other Ambulatory Visit (HOSPITAL_COMMUNITY): Payer: Self-pay | Admitting: Internal Medicine

## 2018-09-16 ENCOUNTER — Ambulatory Visit (HOSPITAL_COMMUNITY)
Admission: RE | Admit: 2018-09-16 | Discharge: 2018-09-16 | Disposition: A | Discharge status: Home / Self Care | Payer: Medicare Other | Source: Ambulatory Visit | Attending: Vascular Surgery | Admitting: Vascular Surgery

## 2018-09-16 DIAGNOSIS — I6529 Occlusion and stenosis of unspecified carotid artery: Secondary | ICD-10-CM | POA: Diagnosis not present

## 2018-09-17 ENCOUNTER — Encounter: Payer: Self-pay | Admitting: *Deleted

## 2018-09-18 ENCOUNTER — Encounter: Payer: Self-pay | Admitting: Diagnostic Neuroimaging

## 2018-09-18 ENCOUNTER — Ambulatory Visit (INDEPENDENT_AMBULATORY_CARE_PROVIDER_SITE_OTHER): Payer: Medicare Other | Admitting: Diagnostic Neuroimaging

## 2018-09-18 VITALS — BP 160/73 | HR 65 | Ht 62.5 in | Wt 148.2 lb

## 2018-09-18 DIAGNOSIS — G459 Transient cerebral ischemic attack, unspecified: Secondary | ICD-10-CM | POA: Diagnosis not present

## 2018-09-18 DIAGNOSIS — H8190 Unspecified disorder of vestibular function, unspecified ear: Secondary | ICD-10-CM

## 2018-09-18 DIAGNOSIS — I6529 Occlusion and stenosis of unspecified carotid artery: Secondary | ICD-10-CM | POA: Diagnosis not present

## 2018-09-18 MED ORDER — CLOPIDOGREL BISULFATE 75 MG PO TABS: 75.0000 mg | ORAL_TABLET | Freq: Every day | ORAL | 11 refills | Status: DC

## 2018-09-18 NOTE — Progress Notes (Signed)
[facility]  PATIENT: Judith [name] DOB: [birth year]  REFERRING CLINICIAN: Perini HISTORY FROM: patient and husband  REASON FOR VISIT: new consult   HISTORICAL  CHIEF COMPLAINT:  Chief Complaint  Patient presents with  . 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  In 2015 patient had onset of vertigo, nausea, triggered by change in position.  She had MRI of the brain at that time which apparently was negative.  She was treated with vestibular PT and Epley maneuvers which helped to resolve her symptoms within a few days.  In December 2019 patient had recurrence of positional vertigo, nausea, diaphoresis, with turning her head to the right.  She tried Epley maneuvers at home but this did not help.  Symptoms continued and worsened.  Patient developed some numbness on her right face.  Patient went to the emergency room for evaluation on 09/13/2018.  Patient had MRI of the brain which was negative.  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  It was not clear if these were symptomatic or not based on ER evaluation.  Therefore patient was recommended to continue medical management and follow-up in outpatient neurology clinic.  Since that time symptoms have resolved.  No further vertigo attacks.  No numbness, slurred speech, double vision, syncope, weakness or dizziness.  Patient has been on aspirin 81 mg for several years as well as simvastatin 20 mg/day.  Her last LDL was in the "80s".  Blood pressure at home ranges in the systolic 638-466 range.  Patient is a prior cigarette smoker  and quit in 1980.  No snoring, excessive daytime sleepiness or other symptoms of sleep apnea.  No palpitations or history of atrial fibrillation.    REVIEW OF SYSTEMS: Full 14 system review of systems performed and negative with exception of: Fatigue feeling cold headache weakness dizziness pain aching muscles allergies.   ALLERGIES: Allergies  Allergen Reactions  . Erythromycin Other (See Comments)    GI upset  . Penicillins Rash  . Sulfa Antibiotics Rash    HOME MEDICATIONS: Outpatient Medications Prior to Visit  Medication Sig Dispense Refill  . aspirin 81 MG tablet Take 81 mg by mouth daily.      Marland Kitchen b complex vitamins capsule Take 1 capsule by mouth daily.    Marland Kitchen latanoprost (XALATAN) 0.005 % ophthalmic solution Place 1 drop into both eyes at bedtime.      . meclizine (ANTIVERT) 12.5 MG tablet Take 1 tablet (12.5 mg total) by mouth 3 (three) times daily as needed for dizziness. 15 tablet 0  . methocarbamol (ROBAXIN) 500 MG tablet Take 2 tablets by mouth 4 (four) times daily as needed.    . Multiple Vitamin (MULTIVITAMIN) capsule Take 1 capsule by mouth daily.      . multivitamin-lutein (OCUVITE-LUTEIN) CAPS capsule Take 1 capsule by mouth daily.    . simvastatin (ZOCOR) 20 MG tablet Take 20 mg by mouth every evening.  3  . timolol (BETIMOL) 0.5 % ophthalmic solution daily.     No facility-administered medications prior to visit.     PAST MEDICAL HISTORY: Past Medical History:  Diagnosis Date  .  Arthritis   . Carotid bruit   . Cervical spondylosis   . Elevated lipids   . GERD (gastroesophageal reflux disease)   . Glaucoma 2011  . Hyperlipidemia   . Hypertension   . Osteoarthritis   . Osteopenia   . RBBB (right bundle branch block)   . Symptomatic PVCs   . Thoracic scoliosis    since teenager  . Vertigo     PAST SURGICAL HISTORY: Past Surgical History:  Procedure Laterality Date  . CATARACT EXTRACTION Bilateral 2014   done 3 weeks apart  . OOPHORECTOMY Left  1969   secondary to dermoid  . ROTATOR CUFF REPAIR Right 09/2002    FAMILY HISTORY: Family History  Problem Relation Age of Onset  . Cancer Mother 10       throat  . Hypertension Father   . Heart disease Father 52  . Prostate cancer Brother 28  . Hypertension Brother   . Liver cancer Maternal Aunt   . Cancer Maternal Aunt   . Stomach cancer Maternal Grandfather   . Cancer Maternal Grandfather   . CAD Maternal Uncle     SOCIAL HISTORY: Social History   Socioeconomic History  . Marital status: Married    Spouse name: Ed  . Number of children: 2  . Years of education: 42  . Highest education level: Not on file  Occupational History  . Occupation: LPN    Employer: [employer]: retired  Scientific laboratory technician  . Financial resource strain: Not on file  . Food insecurity:    Worry: Not on file    Inability: Not on file  . Transportation needs:    Medical: Not on file    Non-medical: Not on file  Tobacco Use  . Smoking status: Former Smoker    Packs/day: 1.00    Years: 20.00    Pack years: 20.00    Last attempt to quit: 09/30/1978    Years since quitting: 39.9  . Smokeless tobacco: Never Used  Substance and Sexual Activity  . Alcohol use: Yes    Alcohol/week: 1.0 - 2.0 standard drinks    Types: 1 - 2 Standard drinks or equivalent per week    Comment: occassionally wine  . Drug use: No  . Sexual activity: Yes    Partners: Male  Lifestyle  . Physical activity:    Days per week: Not on file    Minutes per session: Not on file  . Stress: Not on file  Relationships  . Social connections:    Talks on phone: Not on file    Gets together: Not on file    Attends religious service: Not on file    Active member of club or organization: Not on file    Attends meetings of clubs or organizations: Not on file    Relationship status: Not on file  . Intimate partner violence:    Fear of current or ex partner: Not on file    Emotionally abused: Not on file     Physically abused: Not on file    Forced sexual activity: Not on file  Other Topics Concern  . Not on file  Social History Narrative   Works out at [employer]   Former smoker   Occasional ETOH   Married   Caffeine 1 daily     PHYSICAL EXAM  GENERAL EXAM/CONSTITUTIONAL: Vitals:  Vitals:   09/18/18 1051  BP: (!) 160/73  Pulse: 65  Weight: 148 lb  3.2 oz (67.2 kg)  Height: 5' 2.5" (1.588 m)     Body mass index is 26.67 kg/m. Wt Readings from Last 3 Encounters:  09/18/18 148 lb 3.2 oz (67.2 kg)  06/15/18 146 lb (66.2 kg)  07/08/16 158 lb (71.7 kg)     Patient is in no distress; well developed, nourished and groomed; neck is supple  CARDIOVASCULAR:  Examination of carotid arteries is normal; no carotid bruits  Regular rate and rhythm, no murmurs  Examination of peripheral vascular system by observation and palpation is normal  EYES:  Ophthalmoscopic exam of optic discs and posterior segments is normal; no papilledema or hemorrhages  Visual Acuity Screening   Right eye Left eye Both eyes  Without correction:     With correction: 20/40 20/30      MUSCULOSKELETAL:  Gait, strength, tone, movements noted in Neurologic exam below  NEUROLOGIC: MENTAL STATUS:  No flowsheet data found.  awake, alert, oriented to person, place and time  recent and remote memory intact  normal attention and concentration  language fluent, comprehension intact, naming intact  fund of knowledge appropriate  CRANIAL NERVE:   2nd - no papilledema on fundoscopic exam  2nd, 3rd, 4th, 6th - pupils equal and reactive to light, visual fields full to confrontation, extraocular muscles intact, no nystagmus  5th - facial sensation symmetric  7th - facial strength symmetric  8th - hearing intact  9th - palate elevates symmetrically, uvula midline  11th - shoulder shrug symmetric  12th - tongue protrusion midline  MOTOR:   normal bulk and tone, full strength in the  BUE, BLE  SENSORY:   normal and symmetric to light touch, temperature, vibration  COORDINATION:   finger-nose-finger, fine finger movements normal  REFLEXES:   deep tendon reflexes TRACE and symmetric  GAIT/STATION:   narrow based gait; SLIGHT DIFF WITH TANDEM; romberg is negative     DIAGNOSTIC DATA (LABS, IMAGING, TESTING) - I reviewed patient records, labs, notes, testing and imaging myself where available.  Lab Results  Component Value Date   WBC 8.5 09/13/2018   HGB 14.4 09/13/2018   HCT 45.3 09/13/2018   MCV 94.6 09/13/2018   PLT 294 09/13/2018      Component Value Date/Time   NA 136 09/13/2018 1244   K 4.1 09/13/2018 1244   CL 100 09/13/2018 1244   CO2 24 09/13/2018 1244   GLUCOSE 147 (H) 09/13/2018 1244   BUN 18 09/13/2018 1244   CREATININE 0.83 09/13/2018 1244   CALCIUM 9.7 09/13/2018 1244   PROT 7.5 09/13/2018 1244   ALBUMIN 4.0 09/13/2018 1244   AST 23 09/13/2018 1244   ALT 16 09/13/2018 1244   ALKPHOS 53 09/13/2018 1244   BILITOT 0.7 09/13/2018 1244   GFRNONAA >60 09/13/2018 1244   GFRAA >60 09/13/2018 1244   No results found for: CHOL, HDL, LDLCALC, LDLDIRECT, TRIG, CHOLHDL Lab Results  Component Value Date   HGBA1C 5.6 05/07/2014   Lab Results  Component Value Date   VITAMINB12 1,243 (H) 05/07/2014   Lab Results  Component Value Date   TSH 0.331 (L) 05/07/2014    09/13/18 MRI brain [I reviewed images myself and agree with interpretation. -VRP]  1.  No acute intracranial abnormality. 2. Essentially stable MRI appearance of the brain since 2015. Mild to moderate for age cerebral white matter signal changes, nonspecific but most commonly due to chronic small vessel disease.  09/13/18 CTA head / neck [I reviewed images myself and agree with interpretation. There is  right ICA origin plaque and calcifications with ~15-20% stenosis. There is left verterbal artery origin plaque and calcifications. -VRP]  1. Negative for large vessel  occlusion or significant arterial stenosis in the head or neck. Atherosclerosis is most pronounced in both ICA siphons. 2. 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 FMD is also suspected in both distal vertebral arteries, and may also involve the left ICA siphon. 3. 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  4. No acute intracranial abnormality. CT appearance of the brain appears stable to the 2015 MRI.  07/15/16 carotid u/s - Heterogeneous plaque, bilaterally. - Normal 1-39% bilateral ICA stenosis. - Normal subclavian arteries, bilaterally. - Patent vertebral arteries with antegrade flow.  09/16/18 carotid u/s Right Carotid: Velocities in the right distal ICA are consistent with a 40-59% stenosis. Left Carotid: Velocities in the left ICA are consistent with a 1-39% stenosis. Vertebrals: Bilateral vertebral arteries demonstrate antegrade flow. Subclavians: Normal flow hemodynamics were seen in bilateral subclavian       arteries.     ASSESSMENT AND PLAN  72 y.o. year old female here with positional vertigo attacks associated with nausea in 2015 and December 2019.  These most likely related to peripheral vestibulopathy such as benign positional vertigo.  Also in December 2018 during ER evaluation for the symptoms she had CTA of the head and neck which showed atherosclerotic changes, plaque, calcifications as well as possible fibromuscular dysplasia changes.  No major stenosis or occlusion found.  These findings are likely incidental and not likely related to the patient's symptoms.  Would recommend to continue medical management.  In the event that symptoms are related to vertebrobasilar TIA, we will change aspirin to Plavix and increase statin dosing for stroke prevention.   Dx:  1. Peripheral vestibulopathy, unspecified  laterality   2. 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 ECHOCARDIOGRAM COMPLETE   Meds ordered this encounter  Medications  . clopidogrel (PLAVIX) 75 MG tablet    Sig: Take 1 tablet (75 mg total) by mouth daily.    Dispense:  30 tablet    Refill:  11   Return in about 6 months (around 03/20/2019).  I reviewed images, labs, notes, records myself. I summarized findings and reviewed with patient, for this high risk condition (TIA) requiring high complexity decision making.    Penni Bombard, MD 16/09/930, 35:57 AM Certified in Neurology, Neurophysiology and Neuroimaging  [facility] [address], [place], [place] [address] [phone]

## 2018-09-18 NOTE — Patient Instructions (Signed)
  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

## 2018-09-24 ENCOUNTER — Encounter: Payer: Self-pay | Admitting: *Deleted

## 2018-09-25 ENCOUNTER — Ambulatory Visit (HOSPITAL_COMMUNITY): Payer: Medicare Other | Attending: Cardiovascular Disease

## 2018-09-25 ENCOUNTER — Other Ambulatory Visit: Payer: Self-pay

## 2018-09-25 DIAGNOSIS — G459 Transient cerebral ischemic attack, unspecified: Secondary | ICD-10-CM | POA: Insufficient documentation

## 2018-10-06 ENCOUNTER — Telehealth: Payer: Self-pay | Admitting: *Deleted

## 2018-10-06 NOTE — Telephone Encounter (Signed)
Discussed with Dr Leta Baptist who recommended patient continue taking ASA 81 mg once a day. Called patient and advised her of same. She repeated correctly, verbalized understanding, appreciation of call.

## 2018-10-06 NOTE — Telephone Encounter (Signed)
Spoke with patient and informed her that her echocardiogram results are unremarkable with no major findings. Advised she continue with Dr Gladstone Lighter plan. She stated she had to stop Plavix after taking it almost 2 weeks. She developed a fine red rash, spontaneous bruising, and had a bad headache while on plavix. The side effects worsened while she was on it. She stopped plavix 3 days ago and began taking ASA 81 mg daily. She stated she continues to have a foggy feeling in her head and a  slight balance problem. Her BP this morning was 155/76; she monitors it daily.  She would like to know if taking ASA 81 mg daily is the right dose for her. This RN advised will discuss with Dr Leta Baptist and call her back. verbalized understanding, appreciation. Plavix added to allergy list.

## 2018-10-21 DIAGNOSIS — M8589 Other specified disorders of bone density and structure, multiple sites: Secondary | ICD-10-CM | POA: Diagnosis not present

## 2018-10-21 DIAGNOSIS — M859 Disorder of bone density and structure, unspecified: Secondary | ICD-10-CM | POA: Diagnosis not present

## 2018-11-11 ENCOUNTER — Other Ambulatory Visit: Payer: Self-pay | Admitting: Internal Medicine

## 2018-11-11 DIAGNOSIS — Z1231 Encounter for screening mammogram for malignant neoplasm of breast: Secondary | ICD-10-CM

## 2018-12-16 ENCOUNTER — Ambulatory Visit: Payer: Medicare Other

## 2019-03-04 DIAGNOSIS — M545 Low back pain: Secondary | ICD-10-CM | POA: Diagnosis not present

## 2019-03-04 DIAGNOSIS — I10 Essential (primary) hypertension: Secondary | ICD-10-CM | POA: Diagnosis not present

## 2019-03-04 DIAGNOSIS — I773 Arterial fibromuscular dysplasia: Secondary | ICD-10-CM | POA: Diagnosis not present

## 2019-03-04 DIAGNOSIS — R42 Dizziness and giddiness: Secondary | ICD-10-CM | POA: Diagnosis not present

## 2019-03-04 DIAGNOSIS — M8580 Other specified disorders of bone density and structure, unspecified site: Secondary | ICD-10-CM | POA: Diagnosis not present

## 2019-03-04 DIAGNOSIS — I729 Aneurysm of unspecified site: Secondary | ICD-10-CM | POA: Diagnosis not present

## 2019-03-04 DIAGNOSIS — R413 Other amnesia: Secondary | ICD-10-CM | POA: Diagnosis not present

## 2019-03-16 ENCOUNTER — Telehealth: Payer: Self-pay | Admitting: *Deleted

## 2019-03-16 NOTE — Telephone Encounter (Signed)
Called patient and LVM advising her that due to current COVID 19 pandemic, our office is severely reducing in person visits in order to minimize the risk to our patients and healthcare providers. We recommend to convert your appointment to a video visit. Otherwise we need to reschedule after June. Requested she call back to discuss.

## 2019-03-19 DIAGNOSIS — H401111 Primary open-angle glaucoma, right eye, mild stage: Secondary | ICD-10-CM | POA: Diagnosis not present

## 2019-03-19 DIAGNOSIS — H401122 Primary open-angle glaucoma, left eye, moderate stage: Secondary | ICD-10-CM | POA: Diagnosis not present

## 2019-03-22 ENCOUNTER — Ambulatory Visit: Payer: Medicare Other | Admitting: Diagnostic Neuroimaging

## 2019-03-26 DIAGNOSIS — J069 Acute upper respiratory infection, unspecified: Secondary | ICD-10-CM | POA: Diagnosis not present

## 2019-03-26 DIAGNOSIS — R509 Fever, unspecified: Secondary | ICD-10-CM | POA: Diagnosis not present

## 2019-03-26 DIAGNOSIS — R51 Headache: Secondary | ICD-10-CM | POA: Diagnosis not present

## 2019-05-25 ENCOUNTER — Encounter: Payer: Self-pay | Admitting: Diagnostic Neuroimaging

## 2019-05-25 ENCOUNTER — Other Ambulatory Visit: Payer: Self-pay

## 2019-05-25 ENCOUNTER — Ambulatory Visit (INDEPENDENT_AMBULATORY_CARE_PROVIDER_SITE_OTHER): Payer: Medicare Other | Admitting: Diagnostic Neuroimaging

## 2019-05-25 VITALS — BP 165/87 | HR 73 | Temp 97.8°F | Ht 63.0 in | Wt 149.6 lb

## 2019-05-25 DIAGNOSIS — G459 Transient cerebral ischemic attack, unspecified: Secondary | ICD-10-CM

## 2019-05-25 DIAGNOSIS — H8190 Unspecified disorder of vestibular function, unspecified ear: Secondary | ICD-10-CM

## 2019-05-25 NOTE — Progress Notes (Signed)
[facility]  PATIENT: Judith [name] DOB: 11/07/1945  REFERRING CLINICIAN: Perini HISTORY FROM: patient and husband  REASON FOR VISIT: follow up    HISTORICAL  CHIEF COMPLAINT:  Chief Complaint  Patient presents with  . Peripheral vestibulopathy    rm 6 FU, husband - Percell Miller, "recent family stress but no vertigo"    HISTORY OF PRESENT ILLNESS:   UPDATE (05/25/19, VRP): Since last visit, doing well. Symptoms are improved. Severity is mild. No alleviating or aggravating factors. Tolerating aspirin and statin (had too much bruising on plavix).    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  In 2015 patient had onset of vertigo, nausea, triggered by change in position.  She had MRI of the brain at that time which apparently was negative.  She was treated with vestibular PT and Epley maneuvers which helped to resolve her symptoms within a few days.  In December 2019 patient had recurrence of positional vertigo, nausea, diaphoresis, with turning her head to the right.  She tried Epley maneuvers at home but this did not help.  Symptoms continued and worsened.  Patient developed some numbness on her right face.  Patient went to the emergency room for evaluation on 09/13/2018.  Patient had MRI of the brain which was negative.  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  It was not clear if these were symptomatic or not based on ER evaluation.  Therefore patient was recommended to continue medical management and follow-up in outpatient neurology clinic.  Since that time symptoms have resolved.  No further vertigo attacks.  No numbness, slurred speech, double vision, syncope, weakness or dizziness.  Patient has been on  aspirin 81 mg for several years as well as simvastatin 20 mg/day.  Her last LDL was in the "80s".  Blood pressure at home ranges in the systolic 123456 range.  Patient is a prior cigarette smoker and quit in 1980.  No snoring, excessive daytime sleepiness or other symptoms of sleep apnea.  No palpitations or history of atrial fibrillation.    REVIEW OF SYSTEMS: Full 14 system review of systems performed and negative with exception of: Fatigue feeling cold headache weakness dizziness pain aching muscles allergies.   ALLERGIES: Allergies  Allergen Reactions  . Erythromycin Other (See Comments)    GI upset  . Penicillins Rash  . Plavix [Clopidogrel Bisulfate] Rash    Spontaneous bruising, headaches  . Sulfa Antibiotics Rash    HOME MEDICATIONS: Outpatient Medications Prior to Visit  Medication Sig Dispense Refill  . aspirin 325 MG EC tablet Take 325 mg by mouth daily.    Marland Kitchen b complex vitamins capsule Take 1 capsule by mouth daily.    Marland Kitchen ibandronate (BONIVA) 150 MG tablet TAKE 1 TABLET BY MOUTH ONCE A MONTH    . latanoprost (XALATAN) 0.005 % ophthalmic solution Place 1 drop into both eyes at bedtime.      . meclizine (ANTIVERT) 12.5 MG tablet Take 1 tablet (12.5 mg total) by mouth 3 (three) times daily as needed for dizziness. 15 tablet 0  . methocarbamol (ROBAXIN) 500 MG tablet Take 2 tablets by mouth 4 (four) times daily as needed.    . Multiple Vitamin (MULTIVITAMIN) capsule Take 1 capsule by mouth daily.      . multivitamin-lutein (OCUVITE-LUTEIN) CAPS capsule Take  1 capsule by mouth daily.    . simvastatin (ZOCOR) 40 MG tablet Take 40 mg by mouth daily.    . timolol (BETIMOL) 0.5 % ophthalmic solution daily.    Marland Kitchen aspirin 81 MG tablet Take 81 mg by mouth daily.      . clopidogrel (PLAVIX) 75 MG tablet Take 1 tablet (75 mg total) by mouth daily. (Patient not taking: Reported on 10/06/2018) 30 tablet 11  . simvastatin (ZOCOR) 20 MG tablet Take 20 mg by mouth every evening.  3   No  facility-administered medications prior to visit.     PAST MEDICAL HISTORY: Past Medical History:  Diagnosis Date  . Arthritis   . Carotid bruit   . Cervical spondylosis   . Elevated lipids   . GERD (gastroesophageal reflux disease)   . Glaucoma 2011  . Hyperlipidemia   . Hypertension   . Osteoarthritis   . Osteopenia   . RBBB (right bundle branch block)   . Symptomatic PVCs   . Thoracic scoliosis    since teenager  . Vertigo     PAST SURGICAL HISTORY: Past Surgical History:  Procedure Laterality Date  . CATARACT EXTRACTION Bilateral 2014   done 3 weeks apart  . OOPHORECTOMY Left 1969   secondary to dermoid  . ROTATOR CUFF REPAIR Right 09/2002    FAMILY HISTORY: Family History  Problem Relation Age of Onset  . Cancer Mother 2       throat  . Hypertension Father   . Heart disease Father 65  . Prostate cancer Brother 4  . Hypertension Brother   . Liver cancer Maternal Aunt   . Cancer Maternal Aunt   . Stomach cancer Maternal Grandfather   . Cancer Maternal Grandfather   . CAD Maternal Uncle     SOCIAL HISTORY: Social History   Socioeconomic History  . Marital status: Married    Spouse name: Ed  . Number of children: 2  . Years of education: 34  . Highest education level: Not on file  Occupational History  . Occupation: LPN    Employer: [employer]: retired  Scientific laboratory technician  . Financial resource strain: Not on file  . Food insecurity    Worry: Not on file    Inability: Not on file  . Transportation needs    Medical: Not on file    Non-medical: Not on file  Tobacco Use  . Smoking status: Former Smoker    Packs/day: 1.00    Years: 20.00    Pack years: 20.00    Quit date: 09/30/1978    Years since quitting: 40.6  . Smokeless tobacco: Never Used  Substance and Sexual Activity  . Alcohol use: Yes    Alcohol/week: 1.0 - 2.0 standard drinks    Types: 1 - 2 Standard drinks or equivalent per week    Comment: occassionally wine   . Drug use: No  . Sexual activity: Yes    Partners: Male  Lifestyle  . Physical activity    Days per week: Not on file    Minutes per session: Not on file  . Stress: Not on file  Relationships  . Social Herbalist on phone: Not on file    Gets together: Not on file    Attends religious service: Not on file    Active member of club or organization: Not on file    Attends meetings of clubs or organizations: Not on file  Relationship status: Not on file  . Intimate partner violence    Fear of current or ex partner: Not on file    Emotionally abused: Not on file    Physically abused: Not on file    Forced sexual activity: Not on file  Other Topics Concern  . Not on file  Social History Narrative   Works out at [employer]   Former smoker   Occasional ETOH   Married   Caffeine 1 daily     PHYSICAL EXAM  GENERAL EXAM/CONSTITUTIONAL: Vitals:  Vitals:   05/25/19 0911  BP: (!) 165/87  Pulse: 73  Temp: 97.8 F (36.6 C)  Weight: 149 lb 9.6 oz (67.9 kg)  Height: 5\' 3"  (1.6 m)   Body mass index is 26.5 kg/m. Wt Readings from Last 3 Encounters:  05/25/19 149 lb 9.6 oz (67.9 kg)  09/18/18 148 lb 3.2 oz (67.2 kg)  06/15/18 146 lb (66.2 kg)    Patient is in no distress; well developed, nourished and groomed; neck is supple  CARDIOVASCULAR:  Examination of carotid arteries is FAINT MURMUR / BRUIT RADIATING TO BILATERAL CAROTIDS  Regular rate and rhythm, no murmurs  Examination of peripheral vascular system by observation and palpation is normal  EYES:  Ophthalmoscopic exam of optic discs and posterior segments is normal; no papilledema or hemorrhages No exam data present  MUSCULOSKELETAL:  Gait, strength, tone, movements noted in Neurologic exam below  NEUROLOGIC: MENTAL STATUS:  No flowsheet data found.  awake, alert, oriented to person, place and time  recent and remote memory intact  normal attention and concentration  language fluent,  comprehension intact, naming intact  fund of knowledge appropriate  CRANIAL NERVE:   2nd - no papilledema on fundoscopic exam  2nd, 3rd, 4th, 6th - pupils equal and reactive to light, visual fields full to confrontation, extraocular muscles intact, no nystagmus  5th - facial sensation symmetric  7th - facial strength symmetric  8th - hearing intact  9th - palate elevates symmetrically, uvula midline  11th - shoulder shrug symmetric  12th - tongue protrusion midline  MOTOR:   normal bulk and tone, full strength in the BUE, BLE  SENSORY:   normal and symmetric to light touch, temperature, vibration  COORDINATION:   finger-nose-finger, fine finger movements normal  REFLEXES:   deep tendon reflexes TRACE and symmetric  GAIT/STATION:   narrow based gait; SLIGHT DIFF WITH TANDEM; romberg is negative     DIAGNOSTIC DATA (LABS, IMAGING, TESTING) - I reviewed patient records, labs, notes, testing and imaging myself where available.  Lab Results  Component Value Date   WBC 8.5 09/13/2018   HGB 14.4 09/13/2018   HCT 45.3 09/13/2018   MCV 94.6 09/13/2018   PLT 294 09/13/2018      Component Value Date/Time   NA 136 09/13/2018 1244   K 4.1 09/13/2018 1244   CL 100 09/13/2018 1244   CO2 24 09/13/2018 1244   GLUCOSE 147 (H) 09/13/2018 1244   BUN 18 09/13/2018 1244   CREATININE 0.83 09/13/2018 1244   CALCIUM 9.7 09/13/2018 1244   PROT 7.5 09/13/2018 1244   ALBUMIN 4.0 09/13/2018 1244   AST 23 09/13/2018 1244   ALT 16 09/13/2018 1244   ALKPHOS 53 09/13/2018 1244   BILITOT 0.7 09/13/2018 1244   GFRNONAA >60 09/13/2018 1244   GFRAA >60 09/13/2018 1244   No results found for: CHOL, HDL, LDLCALC, LDLDIRECT, TRIG, CHOLHDL Lab Results  Component Value Date   HGBA1C 5.6 05/07/2014  Lab Results  Component Value Date   VITAMINB12 1,243 (H) 05/07/2014   Lab Results  Component Value Date   TSH 0.331 (L) 05/07/2014    09/13/18 MRI brain [I reviewed images  myself and agree with interpretation. -VRP]  1.  No acute intracranial abnormality. 2. Essentially stable MRI appearance of the brain since 2015. Mild to moderate for age cerebral white matter signal changes, nonspecific but most commonly due to chronic small vessel disease.  09/13/18 CTA head / neck [I reviewed images myself and agree with interpretation. There is right ICA origin plaque and calcifications with ~15-20% stenosis. There is left verterbal artery origin plaque and calcifications. -VRP]  1. Negative for large vessel occlusion or significant arterial stenosis in the head or neck. Atherosclerosis is most pronounced in both ICA siphons. 2. 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 FMD is also suspected in both distal vertebral arteries, and may also involve the left ICA siphon. 3. 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  4. No acute intracranial abnormality. CT appearance of the brain appears stable to the 2015 MRI.  07/15/16 carotid u/s - Heterogeneous plaque, bilaterally. - Normal 1-39% bilateral ICA stenosis. - Normal subclavian arteries, bilaterally. - Patent vertebral arteries with antegrade flow.  09/16/18 carotid u/s Right Carotid: Velocities in the right distal ICA are consistent with a 40-59% stenosis. Left Carotid: Velocities in the left ICA are consistent with a 1-39% stenosis. Vertebrals: Bilateral vertebral arteries demonstrate antegrade flow. Subclavians: Normal flow hemodynamics were seen in bilateral subclavian       arteries.  09/25/18 TTE - Left ventricle: The cavity size was normal. Systolic function was   normal. The estimated ejection fraction was in the range of 60%   to 65%. Wall motion was normal; there were no regional wall   motion abnormalities. Doppler parameters are consistent with    abnormal left ventricular relaxation (grade 1 diastolic   dysfunction). - Aortic valve: There was no regurgitation. - Mitral valve: Transvalvular velocity was within the normal range.   There was no evidence for stenosis. There was mild regurgitation. - Right ventricle: The cavity size was normal. Wall thickness was   normal. Systolic function was normal. - Atrial septum: No defect or patent foramen ovale was identified. - Tricuspid valve: There was trivial regurgitation. - Pulmonary arteries: Systolic pressure was within the normal   range. PA peak pressure: 24 mm Hg (S).   ASSESSMENT AND PLAN  73 y.o. year old female here with positional vertigo attacks associated with nausea in 2015 and December 2019.  These most likely related to peripheral vestibulopathy such as benign positional vertigo.  Also in December 2018 during ER evaluation for the symptoms she had CTA of the head and neck which showed atherosclerotic changes, plaque, calcifications as well as possible fibromuscular dysplasia changes.  No major stenosis or occlusion found.  These findings are likely incidental and not likely related to the patient's symptoms.  Would recommend to continue medical management.    Dx:  1. Peripheral vestibulopathy, unspecified laterality   2. TIA (transient ischemic attack)     PLAN:  VERTIGO / NAUSEA / SWEATING / NUMBNESS (likely peripheral vestibulopathy; less likely vertebrobasilar TIA) - continue aspirin and statin - follow up with PCP re: BP control  ABNORMAL CTA HEAD / NECK (incidental findings: fibromuscular dysplasia + atherosclerosis + calcifications /  plaque + pseudoaneurysm) - control vascular risk factors as above - annual carotid u/s per PCP  Return for return to PCP, pending if symptoms worsen or fail to improve.      Penni Bombard, MD [id number], [id number] AM Certified in Neurology, Neurophysiology and Neuroimaging  [facility] [address]  [place], [place] [address] [phone]

## 2019-06-15 ENCOUNTER — Other Ambulatory Visit: Payer: Self-pay | Admitting: Internal Medicine

## 2019-06-15 DIAGNOSIS — N644 Mastodynia: Secondary | ICD-10-CM

## 2019-06-22 ENCOUNTER — Ambulatory Visit
Admission: RE | Admit: 2019-06-22 | Discharge: 2019-06-22 | Disposition: A | Discharge status: Home / Self Care | Payer: Medicare Other | Source: Ambulatory Visit | Attending: Internal Medicine | Admitting: Internal Medicine

## 2019-06-22 ENCOUNTER — Other Ambulatory Visit: Payer: Self-pay

## 2019-06-22 DIAGNOSIS — R928 Other abnormal and inconclusive findings on diagnostic imaging of breast: Secondary | ICD-10-CM | POA: Diagnosis not present

## 2019-06-22 DIAGNOSIS — N644 Mastodynia: Secondary | ICD-10-CM

## 2019-06-23 DIAGNOSIS — Z23 Encounter for immunization: Secondary | ICD-10-CM | POA: Diagnosis not present

## 2019-07-15 ENCOUNTER — Encounter: Payer: Self-pay | Admitting: Obstetrics & Gynecology

## 2019-07-15 ENCOUNTER — Other Ambulatory Visit: Payer: Self-pay

## 2019-07-15 ENCOUNTER — Ambulatory Visit (INDEPENDENT_AMBULATORY_CARE_PROVIDER_SITE_OTHER): Payer: Medicare Other | Admitting: Obstetrics & Gynecology

## 2019-07-15 VITALS — BP 120/70 | HR 84 | Temp 96.7°F | Ht 63.0 in | Wt 150.0 lb

## 2019-07-15 DIAGNOSIS — N8110 Cystocele, unspecified: Secondary | ICD-10-CM | POA: Diagnosis not present

## 2019-07-15 DIAGNOSIS — Z4689 Encounter for fitting and adjustment of other specified devices: Secondary | ICD-10-CM | POA: Diagnosis not present

## 2019-07-15 DIAGNOSIS — R350 Frequency of micturition: Secondary | ICD-10-CM

## 2019-07-15 LAB — POCT URINALYSIS DIPSTICK
Bilirubin, UA: NEGATIVE
Glucose, UA: NEGATIVE
Ketones, UA: NEGATIVE
Nitrite, UA: NEGATIVE
Protein, UA: POSITIVE — AB
Urobilinogen, UA: 0.2 E.U./dL
pH, UA: 5 (ref 5.0–8.0)

## 2019-07-15 NOTE — Progress Notes (Signed)
GYNECOLOGY  VISIT  CC:   UTI symptoms  HPI: 73 y.o. G76P2012 Married White or Caucasian female here for repeat assessment of cystocele.  Reports she's up on her feet a lot more due to family that has moved in with her.  She thinks this has caused a change.  She's having more urinary urgency at night.  She does not feel like she is emptying her bladder adequately.  She does have urinary urgency and sometimes it take a while to start her stream of urine.  States "I think gravity is winning".  Also, she does report cloudy urine from time to time due denies dysuria, hematuria, low back pain or pelvic pain.  We have discussed treatment options in the past and she wants to review these today--PT, pessary use and surgery discussed.  She is absolutely note interested in surgical correction.  Would be interested in a pessary.  Willing to proceed with this today if appropriate.    GYNECOLOGIC HISTORY: Patient's last menstrual period was 09/30/1993. Contraception: PMP Menopausal hormone therapy: none   Patient Active Problem List   Diagnosis Date Noted  . 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 Vertigo 05/07/2014  . Hyperglycemia 05/07/2014  . GERD (gastroesophageal reflux disease) 05/07/2014  . Glaucoma 05/07/2014  . Osteoarthritis   . Bruit 07/04/2011  . HTN (hypertension) 07/04/2011  . Mixed hyperlipidemia 07/04/2011  . 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  . Palpitations 07/04/2011    Past Medical History:  Diagnosis Date  . Arthritis   . Carotid bruit   . Cervical spondylosis   . Elevated lipids   . GERD (gastroesophageal reflux disease)   . Glaucoma 2011  . Hyperlipidemia   . Hypertension   . Osteoarthritis   . Osteopenia   . RBBB (right bundle branch block)   . Symptomatic PVCs   . Thoracic scoliosis    since teenager  . Vertigo     Past Surgical History:  Procedure Laterality Date  . CATARACT EXTRACTION Bilateral 2014   done  3 weeks apart  . OOPHORECTOMY Left 1969   secondary to dermoid  . ROTATOR CUFF REPAIR Right 09/2002    MEDS:   Current Outpatient Medications on File Prior to Visit  Medication Sig Dispense Refill  . aspirin 325 MG EC tablet Take 325 mg by mouth daily.    Marland Kitchen b complex vitamins capsule Take 1 capsule by mouth daily.    Marland Kitchen ibandronate (BONIVA) 150 MG tablet TAKE 1 TABLET BY MOUTH ONCE A MONTH    . latanoprost (XALATAN) 0.005 % ophthalmic solution Place 1 drop into both eyes at bedtime.      . Multiple Vitamin (MULTIVITAMIN) capsule Take 1 capsule by mouth daily.      . multivitamin-lutein (OCUVITE-LUTEIN) CAPS capsule Take 1 capsule by mouth daily.    . simvastatin (ZOCOR) 40 MG tablet Take 40 mg by mouth daily.    . timolol (BETIMOL) 0.5 % ophthalmic solution daily.     No current facility-administered medications on file prior to visit.     ALLERGIES: Erythromycin, Penicillins, Plavix [clopidogrel bisulfate], and Sulfa antibiotics  Family History  Problem Relation Age of Onset  . Cancer Mother 38       throat  . Hypertension Father   . Heart disease Father 54  . Prostate cancer Brother 62  . Hypertension Brother   . Liver cancer Maternal Aunt   . Cancer Maternal Aunt   . Stomach cancer Maternal  Grandfather   . Cancer Maternal Grandfather   . CAD Maternal Uncle     SH:  Married, non smoker  Review of Systems  Genitourinary: Positive for difficulty urinating, dysuria and frequency.  All other systems reviewed and are negative.   PHYSICAL EXAMINATION:    BP 120/70   Pulse 84   Temp (!) 96.7 F (35.9 C) (Temporal)   Ht 5\' 3"  (1.6 m)   Wt 150 lb (68 kg)   LMP 09/30/1993   BMI 26.57 kg/m     General appearance: alert, cooperative and appears stated age Abdomen: soft, non-tender; bowel sounds normal; no masses,  no organomegaly Lymph:  no inguinal LAD noted  Pelvic: External genitalia:  no lesions              Urethra:  normal appearing urethra with no masses,  tenderness or lesions              Bartholins and Skenes: normal                 Vagina: normal appearing vagina with normal color and discharge, no lesions, 3rd degree cystocele noted              Cervix: no lesions              Bimanual Exam:  Uterus:  normal size, contour, position, consistency, mobility, non-tender              Adnexa: no mass, fullness, tenderness   Procedure:  Pessary placement.  #3 incontinence ring with support placed.  Pt walked and voided easily with this.  She could not expel pessary with valsalva maneuver.  She removed placement pessary and was able to place #3 incontinence ring pessary on her own.  Chaperone was present for exam.  Assessment: 3rd degree cystocele Pessary placement today Sensation of incomplete bladder emptying Cloudy urine from time to time  Plan: Urine culture pending Pt taught placement and removal of pessary as well as cleaning.  She will take out every two weeks and leave out overnight.  She knows to call with any increased issues with discharge or vaginal bleeding.  Also, if she cannot remove or replace, she will need to be seen every 3 months but I doubt this will be necessary.   ~30 minutes spent with patient >50% of time was in face to face discussion of above.

## 2019-07-15 NOTE — Patient Instructions (Signed)
Every two weeks, take out over night.  You can wash it with soap and water and just let it air dry.

## 2019-07-16 LAB — URINALYSIS, MICROSCOPIC ONLY
Casts: NONE SEEN /lpf
WBC, UA: >30 /hpf — AB (ref 0–5)

## 2019-07-18 ENCOUNTER — Encounter: Payer: Self-pay | Admitting: Obstetrics & Gynecology

## 2019-07-18 MED ORDER — NITROFURANTOIN MONOHYD MACRO 100 MG PO CAPS: 100.0000 mg | ORAL_CAPSULE | Freq: Two times a day (BID) | ORAL | 0 refills | Status: DC

## 2019-07-19 ENCOUNTER — Telehealth: Payer: Self-pay | Admitting: *Deleted

## 2019-07-19 DIAGNOSIS — R319 Hematuria, unspecified: Secondary | ICD-10-CM

## 2019-07-19 DIAGNOSIS — N8110 Cystocele, unspecified: Secondary | ICD-10-CM

## 2019-07-19 NOTE — Telephone Encounter (Signed)
Called patient. Left voicemail to call back.  Please schedule patient for Nurse visit.

## 2019-07-19 NOTE — Telephone Encounter (Signed)
-----   Message from Megan Salon, MD sent at 07/18/2019 12:08 PM EDT ----- Pt notified personally of positive urine culture showing e coli.  Rx for macrobid 100mg  bid x 5 days sent to pharmacy on file.  Renal function normal.  She did have red cells in her urine so needs repeat urine culture and micro in about 2 weeks.  Please call and schedule.  Thanks.

## 2019-07-20 LAB — URINE CULTURE

## 2019-07-20 NOTE — Telephone Encounter (Signed)
Appt scheduled for patient 08/02/19

## 2019-07-22 ENCOUNTER — Telehealth: Payer: Self-pay | Admitting: Obstetrics & Gynecology

## 2019-07-22 ENCOUNTER — Encounter: Payer: Self-pay | Admitting: Obstetrics & Gynecology

## 2019-07-22 ENCOUNTER — Ambulatory Visit (INDEPENDENT_AMBULATORY_CARE_PROVIDER_SITE_OTHER): Payer: Medicare Other | Admitting: Obstetrics & Gynecology

## 2019-07-22 ENCOUNTER — Other Ambulatory Visit: Payer: Self-pay

## 2019-07-22 VITALS — BP 152/80 | HR 80 | Temp 97.2°F | Ht 63.0 in | Wt 149.6 lb

## 2019-07-22 DIAGNOSIS — N898 Other specified noninflammatory disorders of vagina: Secondary | ICD-10-CM | POA: Diagnosis not present

## 2019-07-22 DIAGNOSIS — Z4689 Encounter for fitting and adjustment of other specified devices: Secondary | ICD-10-CM | POA: Diagnosis not present

## 2019-07-22 NOTE — Patient Instructions (Signed)
Apply coconut oil to the pessary before you replace it next week.

## 2019-07-22 NOTE — Progress Notes (Signed)
GYNECOLOGY  VISIT  CC:  Vaginal discharge   HPI: 73 y.o. G10P2012 Married White or Caucasian female here for yellow/brown discharge that occurred yesterday after trying to move a table all by herself.  She noticed it twice but reports that it seemed to resolve today.  Denies vaginal bleeding.  Reports she does feel much better with pessary in place.  Is very happy with decreased pressure and fullness sensations.  Denies any new urinary symptoms.  Still has one day of antibiotic for UTI left to take.  Is scheduled for repeat urine culture due to her UTI that was only mildly symptomatic.   GYNECOLOGIC HISTORY: Patient's last menstrual period was 09/30/1993. Contraception: PMP Menopausal hormone therapy: none  Patient Active Problem List   Diagnosis Date Noted  . 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 Vertigo 05/07/2014  . Hyperglycemia 05/07/2014  . GERD (gastroesophageal reflux disease) 05/07/2014  . Glaucoma 05/07/2014  . Osteoarthritis   . Bruit 07/04/2011  . HTN (hypertension) 07/04/2011  . Mixed hyperlipidemia 07/04/2011  . 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  . Palpitations 07/04/2011    Past Medical History:  Diagnosis Date  . Arthritis   . Carotid bruit   . Cervical spondylosis   . Elevated lipids   . GERD (gastroesophageal reflux disease)   . Glaucoma 2011  . Hyperlipidemia   . Hypertension   . Osteoarthritis   . Osteopenia   . RBBB (right bundle branch block)   . Symptomatic PVCs   . Thoracic scoliosis    since teenager  . Vertigo     Past Surgical History:  Procedure Laterality Date  . CATARACT EXTRACTION Bilateral 2014   done 3 weeks apart  . OOPHORECTOMY Left 1969   secondary to dermoid  . ROTATOR CUFF REPAIR Right 09/2002    MEDS:   Current Outpatient Medications on File Prior to Visit  Medication Sig Dispense Refill  . aspirin 325 MG EC tablet Take 325 mg by mouth daily.    Marland Kitchen b complex vitamins  capsule Take 1 capsule by mouth daily.    Marland Kitchen ibandronate (BONIVA) 150 MG tablet TAKE 1 TABLET BY MOUTH ONCE A MONTH    . latanoprost (XALATAN) 0.005 % ophthalmic solution Place 1 drop into both eyes at bedtime.      . Multiple Vitamin (MULTIVITAMIN) capsule Take 1 capsule by mouth daily.      . multivitamin-lutein (OCUVITE-LUTEIN) CAPS capsule Take 1 capsule by mouth daily.    . nitrofurantoin, macrocrystal-monohydrate, (MACROBID) 100 MG capsule Take 1 capsule (100 mg total) by mouth 2 (two) times daily. 10 capsule 0  . simvastatin (ZOCOR) 40 MG tablet Take 40 mg by mouth daily.    . timolol (BETIMOL) 0.5 % ophthalmic solution daily.     No current facility-administered medications on file prior to visit.     ALLERGIES: Erythromycin, Penicillins, Plavix [clopidogrel bisulfate], and Sulfa antibiotics  Family History  Problem Relation Age of Onset  . Cancer Mother 65       throat  . Hypertension Father   . Heart disease Father 73  . Prostate cancer Brother 40  . Hypertension Brother   . Liver cancer Maternal Aunt   . Cancer Maternal Aunt   . Stomach cancer Maternal Grandfather   . Cancer Maternal Grandfather   . CAD Maternal Uncle     SH:  Married, non smoker  Review of Systems  Genitourinary: Positive for pelvic pain and vaginal discharge.  All other systems reviewed and are negative.   PHYSICAL EXAMINATION:    BP (!) 152/80   Pulse 80   Temp (!) 97.2 F (36.2 C) (Temporal)   Ht 5\' 3"  (1.6 m)   Wt 149 lb 9.6 oz (67.9 kg)   LMP 09/30/1993   BMI 26.50 kg/m     General appearance: alert, cooperative and appears stated age Lymph:  no inguinal LAD noted  Pelvic: External genitalia:  no lesions              Urethra:  normal appearing urethra with no masses, tenderness or lesions              Bartholins and Skenes: normal                 Vagina: pessary was removed and the vagina is atrophic but normal in appearance without any lesions              Cervix: no lesions               Bimanual Exam:  Uterus:  normal size, contour, position, consistency, mobility, non-tender              Adnexa: no mass, fullness, tenderness  Pessary was cleansed and replaced without difficulty  Chaperone was present for exam.  Assessment: Vaginal discharge that has resolved (after trying to move a table) Cystocele Atrophic vaginal tissue  Plan: She is going to try to remove and replace the pessary every few days and place coconut oil on it for the atrophic vaginal tissue After she's done this for a week or two, she will then just remove the pessary every two weeks and leave out overnight.  She will use coconut oil with replacement every two weeks.

## 2019-07-22 NOTE — Telephone Encounter (Signed)
Spoke with patient. Seen in office on 07/15/19 for pessary placement. Tx for e coli UTI, will complete Macrobid today. Is scheduled for repeat UC 11/2.   Reports tan/yellow vaginal d/c and RLQ discomfort that started on 10/21. Denies odor, fever/chills, N/V or urinary symptoms. Pessary still in place. Patient is unsure of next steps.   Advised patient I will review with Dr. Sabra Heck and return call with recommendations. Patient agreeable.   Dr. Sabra Heck -please advise.

## 2019-07-22 NOTE — Telephone Encounter (Signed)
I can see her at 4:15pm today if she can come.  That would be good.  Thanks.

## 2019-07-22 NOTE — Telephone Encounter (Signed)
Patient recently had pessary inserted. States she was also recently treated for UTI, last day of antibiotics is today. Last night, she started having "light tan/yellow vaginal draining". She is also having soreness on her right side. Would like to know if she should take out, clean, and reinsert her pessary?

## 2019-07-22 NOTE — Telephone Encounter (Signed)
Spoke with patient. OV scheduled for today at 4:15pm with Dr. Sabra Heck. [id number] prescreen negative, precautions reviewed. Patient is agreeable to date and time.   Encounter closed.

## 2019-08-02 ENCOUNTER — Telehealth: Payer: Self-pay | Admitting: Obstetrics & Gynecology

## 2019-08-02 ENCOUNTER — Ambulatory Visit (INDEPENDENT_AMBULATORY_CARE_PROVIDER_SITE_OTHER): Payer: Medicare Other

## 2019-08-02 ENCOUNTER — Other Ambulatory Visit: Payer: Self-pay

## 2019-08-02 VITALS — BP 170/100 | HR 76 | Temp 97.5°F | Ht 63.0 in | Wt 149.0 lb

## 2019-08-02 DIAGNOSIS — R319 Hematuria, unspecified: Secondary | ICD-10-CM

## 2019-08-02 DIAGNOSIS — Z659 Problem related to unspecified psychosocial circumstances: Secondary | ICD-10-CM | POA: Diagnosis not present

## 2019-08-02 DIAGNOSIS — R030 Elevated blood-pressure reading, without diagnosis of hypertension: Secondary | ICD-10-CM | POA: Diagnosis not present

## 2019-08-02 DIAGNOSIS — Z8619 Personal history of other infectious and parasitic diseases: Secondary | ICD-10-CM | POA: Diagnosis not present

## 2019-08-02 LAB — POCT URINALYSIS DIPSTICK
Bilirubin, UA: NEGATIVE
Blood, UA: NEGATIVE
Glucose, UA: NEGATIVE
Ketones, UA: NEGATIVE
Nitrite, UA: NEGATIVE
Protein, UA: NEGATIVE
Urobilinogen, UA: 0.2 E.U./dL
pH, UA: 5 (ref 5.0–8.0)

## 2019-08-02 NOTE — Progress Notes (Signed)
Patient here for TOC urine. She completed treatment for UTI and denies symptoms.  Urine Dip: 1+ WBCs, Ph 5.0  She denied history of blood pressure issues but reports significant family stress at present. Vitals reviewed with Dr. Sabra Heck. We contacted Dr.Perini and patient given 3:45 work in appointment today.  Urine micro and culture to lab due to abnormal urine dip.

## 2019-08-02 NOTE — Telephone Encounter (Signed)
As requested by Lamont Snowball, RN urgent appointment scheduled for patient with [facility]. Spoke with [place] She advised Dr Joylene Draft is not in the office today, but appointment has been scheduled for today, 08/02/2019 with Nurse Practitioner, Ria Comment at 4:00 PM, patient will need to arrive at 3:45 PM. Information conveyed to patient, while in our office. Patient is agreeable.   cc: Lamont Snowball, RN

## 2019-08-02 NOTE — Telephone Encounter (Signed)
See nurse visit not from today.  Encounter closed.

## 2019-08-03 LAB — URINALYSIS, MICROSCOPIC ONLY: Casts: NONE SEEN /lpf

## 2019-08-04 LAB — URINE CULTURE

## 2019-08-23 DIAGNOSIS — I10 Essential (primary) hypertension: Secondary | ICD-10-CM | POA: Diagnosis not present

## 2019-08-23 DIAGNOSIS — Z659 Problem related to unspecified psychosocial circumstances: Secondary | ICD-10-CM | POA: Diagnosis not present

## 2019-09-08 DIAGNOSIS — H401111 Primary open-angle glaucoma, right eye, mild stage: Secondary | ICD-10-CM | POA: Diagnosis not present

## 2019-09-08 DIAGNOSIS — H401122 Primary open-angle glaucoma, left eye, moderate stage: Secondary | ICD-10-CM | POA: Diagnosis not present

## 2019-10-04 DIAGNOSIS — M859 Disorder of bone density and structure, unspecified: Secondary | ICD-10-CM | POA: Diagnosis not present

## 2019-10-04 DIAGNOSIS — I10 Essential (primary) hypertension: Secondary | ICD-10-CM | POA: Diagnosis not present

## 2019-10-04 DIAGNOSIS — E7849 Other hyperlipidemia: Secondary | ICD-10-CM | POA: Diagnosis not present

## 2019-10-11 DIAGNOSIS — M8580 Other specified disorders of bone density and structure, unspecified site: Secondary | ICD-10-CM | POA: Diagnosis not present

## 2019-10-11 DIAGNOSIS — Z659 Problem related to unspecified psychosocial circumstances: Secondary | ICD-10-CM | POA: Diagnosis not present

## 2019-10-11 DIAGNOSIS — Z1389 Encounter for screening for other disorder: Secondary | ICD-10-CM | POA: Diagnosis not present

## 2019-10-11 DIAGNOSIS — I10 Essential (primary) hypertension: Secondary | ICD-10-CM | POA: Diagnosis not present

## 2019-10-11 DIAGNOSIS — Z0000 Encounter for general adult medical examination without abnormal findings: Secondary | ICD-10-CM | POA: Diagnosis not present

## 2019-12-24 DIAGNOSIS — H401131 Primary open-angle glaucoma, bilateral, mild stage: Secondary | ICD-10-CM | POA: Diagnosis not present

## 2020-01-19 DIAGNOSIS — H401111 Primary open-angle glaucoma, right eye, mild stage: Secondary | ICD-10-CM | POA: Diagnosis not present

## 2020-02-09 DIAGNOSIS — H401122 Primary open-angle glaucoma, left eye, moderate stage: Secondary | ICD-10-CM | POA: Diagnosis not present

## 2020-02-22 ENCOUNTER — Other Ambulatory Visit (HOSPITAL_COMMUNITY): Payer: Self-pay | Admitting: Internal Medicine

## 2020-02-22 ENCOUNTER — Ambulatory Visit (HOSPITAL_COMMUNITY)
Admission: RE | Admit: 2020-02-22 | Discharge: 2020-02-22 | Disposition: A | Discharge status: Home / Self Care | Payer: Medicare Other | Source: Ambulatory Visit | Attending: Internal Medicine | Admitting: Internal Medicine

## 2020-02-22 ENCOUNTER — Other Ambulatory Visit: Payer: Self-pay

## 2020-02-22 DIAGNOSIS — I6529 Occlusion and stenosis of unspecified carotid artery: Secondary | ICD-10-CM

## 2020-03-15 DIAGNOSIS — Z0120 Encounter for dental examination and cleaning without abnormal findings: Secondary | ICD-10-CM | POA: Diagnosis not present

## 2020-04-05 DIAGNOSIS — H401111 Primary open-angle glaucoma, right eye, mild stage: Secondary | ICD-10-CM | POA: Diagnosis not present

## 2020-04-05 DIAGNOSIS — H401122 Primary open-angle glaucoma, left eye, moderate stage: Secondary | ICD-10-CM | POA: Diagnosis not present

## 2020-04-25 DIAGNOSIS — I773 Arterial fibromuscular dysplasia: Secondary | ICD-10-CM | POA: Diagnosis not present

## 2020-04-25 DIAGNOSIS — Z659 Problem related to unspecified psychosocial circumstances: Secondary | ICD-10-CM | POA: Diagnosis not present

## 2020-04-25 DIAGNOSIS — I10 Essential (primary) hypertension: Secondary | ICD-10-CM | POA: Diagnosis not present

## 2020-04-25 DIAGNOSIS — M8580 Other specified disorders of bone density and structure, unspecified site: Secondary | ICD-10-CM | POA: Diagnosis not present

## 2020-05-18 ENCOUNTER — Other Ambulatory Visit: Payer: Self-pay | Admitting: Internal Medicine

## 2020-05-18 DIAGNOSIS — Z1231 Encounter for screening mammogram for malignant neoplasm of breast: Secondary | ICD-10-CM

## 2020-06-22 ENCOUNTER — Ambulatory Visit
Admission: RE | Admit: 2020-06-22 | Discharge: 2020-06-22 | Disposition: A | Discharge status: Home / Self Care | Payer: Medicare Other | Source: Ambulatory Visit | Attending: Internal Medicine | Admitting: Internal Medicine

## 2020-06-22 ENCOUNTER — Other Ambulatory Visit: Payer: Self-pay

## 2020-06-22 DIAGNOSIS — Z1231 Encounter for screening mammogram for malignant neoplasm of breast: Secondary | ICD-10-CM

## 2020-10-11 DIAGNOSIS — N951 Menopausal and female climacteric states: Secondary | ICD-10-CM | POA: Diagnosis not present

## 2020-10-11 DIAGNOSIS — E785 Hyperlipidemia, unspecified: Secondary | ICD-10-CM | POA: Diagnosis not present

## 2020-10-11 DIAGNOSIS — M859 Disorder of bone density and structure, unspecified: Secondary | ICD-10-CM | POA: Diagnosis not present

## 2020-10-17 DIAGNOSIS — E785 Hyperlipidemia, unspecified: Secondary | ICD-10-CM | POA: Diagnosis not present

## 2020-10-17 DIAGNOSIS — I10 Essential (primary) hypertension: Secondary | ICD-10-CM | POA: Diagnosis not present

## 2020-10-17 DIAGNOSIS — I6529 Occlusion and stenosis of unspecified carotid artery: Secondary | ICD-10-CM | POA: Diagnosis not present

## 2020-10-17 DIAGNOSIS — Z0000 Encounter for general adult medical examination without abnormal findings: Secondary | ICD-10-CM | POA: Diagnosis not present

## 2020-10-18 DIAGNOSIS — Z961 Presence of intraocular lens: Secondary | ICD-10-CM | POA: Diagnosis not present

## 2020-10-18 DIAGNOSIS — H401132 Primary open-angle glaucoma, bilateral, moderate stage: Secondary | ICD-10-CM | POA: Diagnosis not present

## 2021-02-02 DIAGNOSIS — M79621 Pain in right upper arm: Secondary | ICD-10-CM | POA: Diagnosis not present

## 2021-02-02 DIAGNOSIS — N644 Mastodynia: Secondary | ICD-10-CM | POA: Diagnosis not present

## 2021-02-02 DIAGNOSIS — I10 Essential (primary) hypertension: Secondary | ICD-10-CM | POA: Diagnosis not present

## 2021-02-06 ENCOUNTER — Other Ambulatory Visit: Payer: Self-pay | Admitting: Registered Nurse

## 2021-02-06 DIAGNOSIS — N644 Mastodynia: Secondary | ICD-10-CM

## 2021-02-27 ENCOUNTER — Ambulatory Visit
Admission: RE | Admit: 2021-02-27 | Discharge: 2021-02-27 | Disposition: A | Discharge status: Home / Self Care | Payer: Medicare Other | Source: Ambulatory Visit | Attending: Registered Nurse | Admitting: Registered Nurse

## 2021-02-27 ENCOUNTER — Other Ambulatory Visit: Payer: Self-pay

## 2021-02-27 ENCOUNTER — Other Ambulatory Visit: Payer: Self-pay | Admitting: Registered Nurse

## 2021-02-27 DIAGNOSIS — N6489 Other specified disorders of breast: Secondary | ICD-10-CM | POA: Diagnosis not present

## 2021-02-27 DIAGNOSIS — R922 Inconclusive mammogram: Secondary | ICD-10-CM | POA: Diagnosis not present

## 2021-02-27 DIAGNOSIS — N644 Mastodynia: Secondary | ICD-10-CM

## 2021-04-11 DIAGNOSIS — M19011 Primary osteoarthritis, right shoulder: Secondary | ICD-10-CM | POA: Diagnosis not present

## 2021-04-11 DIAGNOSIS — M25511 Pain in right shoulder: Secondary | ICD-10-CM | POA: Diagnosis not present

## 2021-04-25 DIAGNOSIS — H401132 Primary open-angle glaucoma, bilateral, moderate stage: Secondary | ICD-10-CM | POA: Diagnosis not present

## 2021-06-08 ENCOUNTER — Other Ambulatory Visit: Payer: Self-pay | Admitting: Internal Medicine

## 2021-06-08 DIAGNOSIS — Z1231 Encounter for screening mammogram for malignant neoplasm of breast: Secondary | ICD-10-CM

## 2021-06-12 DIAGNOSIS — R42 Dizziness and giddiness: Secondary | ICD-10-CM | POA: Diagnosis not present

## 2021-06-12 DIAGNOSIS — Z20828 Contact with and (suspected) exposure to other viral communicable diseases: Secondary | ICD-10-CM | POA: Diagnosis not present

## 2021-06-12 DIAGNOSIS — I10 Essential (primary) hypertension: Secondary | ICD-10-CM | POA: Diagnosis not present

## 2021-06-12 DIAGNOSIS — Z20822 Contact with and (suspected) exposure to covid-19: Secondary | ICD-10-CM | POA: Diagnosis not present

## 2021-07-11 ENCOUNTER — Ambulatory Visit
Admission: RE | Admit: 2021-07-11 | Discharge: 2021-07-11 | Disposition: A | Discharge status: Home / Self Care | Payer: Medicare Other | Source: Ambulatory Visit | Attending: Internal Medicine | Admitting: Internal Medicine

## 2021-07-11 ENCOUNTER — Other Ambulatory Visit: Payer: Self-pay

## 2021-07-11 DIAGNOSIS — Z1231 Encounter for screening mammogram for malignant neoplasm of breast: Secondary | ICD-10-CM | POA: Diagnosis not present

## 2021-10-03 DIAGNOSIS — Z961 Presence of intraocular lens: Secondary | ICD-10-CM | POA: Diagnosis not present

## 2021-10-03 DIAGNOSIS — H401132 Primary open-angle glaucoma, bilateral, moderate stage: Secondary | ICD-10-CM | POA: Diagnosis not present

## 2021-11-19 DIAGNOSIS — I739 Peripheral vascular disease, unspecified: Secondary | ICD-10-CM | POA: Diagnosis not present

## 2021-11-19 DIAGNOSIS — E785 Hyperlipidemia, unspecified: Secondary | ICD-10-CM | POA: Diagnosis not present

## 2021-11-19 DIAGNOSIS — I10 Essential (primary) hypertension: Secondary | ICD-10-CM | POA: Diagnosis not present

## 2021-11-19 DIAGNOSIS — M2550 Pain in unspecified joint: Secondary | ICD-10-CM | POA: Diagnosis not present

## 2021-11-28 DIAGNOSIS — I10 Essential (primary) hypertension: Secondary | ICD-10-CM | POA: Diagnosis not present

## 2021-11-28 DIAGNOSIS — E785 Hyperlipidemia, unspecified: Secondary | ICD-10-CM | POA: Diagnosis not present

## 2021-12-05 DIAGNOSIS — I10 Essential (primary) hypertension: Secondary | ICD-10-CM | POA: Diagnosis not present

## 2021-12-05 DIAGNOSIS — R82998 Other abnormal findings in urine: Secondary | ICD-10-CM | POA: Diagnosis not present

## 2021-12-05 DIAGNOSIS — Z0000 Encounter for general adult medical examination without abnormal findings: Secondary | ICD-10-CM | POA: Diagnosis not present

## 2021-12-05 DIAGNOSIS — I6529 Occlusion and stenosis of unspecified carotid artery: Secondary | ICD-10-CM | POA: Diagnosis not present

## 2021-12-05 DIAGNOSIS — E785 Hyperlipidemia, unspecified: Secondary | ICD-10-CM | POA: Diagnosis not present

## 2021-12-06 DIAGNOSIS — E663 Overweight: Secondary | ICD-10-CM | POA: Diagnosis not present

## 2021-12-06 DIAGNOSIS — M353 Polymyalgia rheumatica: Secondary | ICD-10-CM | POA: Diagnosis not present

## 2021-12-06 DIAGNOSIS — Z6827 Body mass index (BMI) 27.0-27.9, adult: Secondary | ICD-10-CM | POA: Diagnosis not present

## 2021-12-06 DIAGNOSIS — Z7952 Long term (current) use of systemic steroids: Secondary | ICD-10-CM | POA: Diagnosis not present

## 2021-12-07 ENCOUNTER — Other Ambulatory Visit (HOSPITAL_COMMUNITY): Payer: Self-pay | Admitting: Internal Medicine

## 2021-12-07 DIAGNOSIS — I779 Disorder of arteries and arterioles, unspecified: Secondary | ICD-10-CM

## 2021-12-11 ENCOUNTER — Other Ambulatory Visit: Payer: Self-pay

## 2021-12-11 ENCOUNTER — Ambulatory Visit (HOSPITAL_COMMUNITY)
Admission: RE | Admit: 2021-12-11 | Discharge: 2021-12-11 | Disposition: A | Discharge status: Home / Self Care | Payer: Medicare Other | Source: Ambulatory Visit | Attending: Internal Medicine | Admitting: Internal Medicine

## 2021-12-11 DIAGNOSIS — I779 Disorder of arteries and arterioles, unspecified: Secondary | ICD-10-CM | POA: Diagnosis not present

## 2022-02-04 DIAGNOSIS — H04332 Acute lacrimal canaliculitis of left lacrimal passage: Secondary | ICD-10-CM | POA: Diagnosis not present

## 2022-02-04 DIAGNOSIS — H04302 Unspecified dacryocystitis of left lacrimal passage: Secondary | ICD-10-CM | POA: Diagnosis not present

## 2022-02-14 DIAGNOSIS — H04202 Unspecified epiphora, left lacrimal gland: Secondary | ICD-10-CM | POA: Diagnosis not present

## 2022-02-14 DIAGNOSIS — H04552 Acquired stenosis of left nasolacrimal duct: Secondary | ICD-10-CM | POA: Diagnosis not present

## 2022-02-28 DIAGNOSIS — Z7952 Long term (current) use of systemic steroids: Secondary | ICD-10-CM | POA: Diagnosis not present

## 2022-02-28 DIAGNOSIS — E663 Overweight: Secondary | ICD-10-CM | POA: Diagnosis not present

## 2022-02-28 DIAGNOSIS — M353 Polymyalgia rheumatica: Secondary | ICD-10-CM | POA: Diagnosis not present

## 2022-02-28 DIAGNOSIS — Z6826 Body mass index (BMI) 26.0-26.9, adult: Secondary | ICD-10-CM | POA: Diagnosis not present

## 2022-03-21 ENCOUNTER — Other Ambulatory Visit: Payer: Self-pay

## 2022-03-21 ENCOUNTER — Encounter (HOSPITAL_COMMUNITY): Payer: Self-pay | Admitting: Optometry

## 2022-03-22 ENCOUNTER — Ambulatory Visit (HOSPITAL_BASED_OUTPATIENT_CLINIC_OR_DEPARTMENT_OTHER): Payer: Medicare Other | Admitting: Physician Assistant

## 2022-03-22 ENCOUNTER — Other Ambulatory Visit: Payer: Self-pay

## 2022-03-22 ENCOUNTER — Encounter (HOSPITAL_COMMUNITY)
Admission: RE | Disposition: A | Discharge status: Home / Self Care | Payer: Self-pay | Source: Home / Self Care | Attending: Optometry

## 2022-03-22 ENCOUNTER — Ambulatory Visit (HOSPITAL_COMMUNITY)
Admission: RE | Admit: 2022-03-22 | Discharge: 2022-03-22 | Disposition: A | Discharge status: Home / Self Care | Payer: Medicare Other | Attending: Optometry | Admitting: Optometry

## 2022-03-22 ENCOUNTER — Ambulatory Visit (HOSPITAL_COMMUNITY): Payer: Medicare Other | Admitting: Physician Assistant

## 2022-03-22 ENCOUNTER — Encounter (HOSPITAL_COMMUNITY): Payer: Self-pay | Admitting: Optometry

## 2022-03-22 DIAGNOSIS — H04512 Dacryolith of left lacrimal passage: Secondary | ICD-10-CM

## 2022-03-22 DIAGNOSIS — H04202 Unspecified epiphora, left lacrimal gland: Secondary | ICD-10-CM | POA: Diagnosis not present

## 2022-03-22 DIAGNOSIS — H04562 Stenosis of left lacrimal punctum: Secondary | ICD-10-CM | POA: Diagnosis present

## 2022-03-22 DIAGNOSIS — H04302 Unspecified dacryocystitis of left lacrimal passage: Secondary | ICD-10-CM | POA: Insufficient documentation

## 2022-03-22 DIAGNOSIS — K219 Gastro-esophageal reflux disease without esophagitis: Secondary | ICD-10-CM | POA: Diagnosis not present

## 2022-03-22 DIAGNOSIS — I10 Essential (primary) hypertension: Secondary | ICD-10-CM | POA: Insufficient documentation

## 2022-03-22 DIAGNOSIS — Z87891 Personal history of nicotine dependence: Secondary | ICD-10-CM | POA: Diagnosis not present

## 2022-03-22 DIAGNOSIS — M1990 Unspecified osteoarthritis, unspecified site: Secondary | ICD-10-CM | POA: Diagnosis not present

## 2022-03-22 DIAGNOSIS — H04542 Stenosis of left lacrimal canaliculi: Secondary | ICD-10-CM | POA: Diagnosis present

## 2022-03-22 DIAGNOSIS — H04552 Acquired stenosis of left nasolacrimal duct: Secondary | ICD-10-CM | POA: Diagnosis not present

## 2022-03-22 HISTORY — PX: LACRIMAL TUBE INSERTION: SHX1905

## 2022-03-22 HISTORY — PX: LACRIMAL DUCT EXPLORATION: SHX6569

## 2022-03-22 HISTORY — PX: DACRORHINOCYSTOTOMY: SHX5559

## 2022-03-22 LAB — BASIC METABOLIC PANEL

## 2022-03-22 LAB — CBC
HCT: 43.3 % (ref 36.0–46.0)
Hemoglobin: 14.2 g/dL (ref 12.0–15.0)
MCH: 31.5 pg (ref 26.0–34.0)
MCHC: 32.8 g/dL (ref 30.0–36.0)
MCV: 96 fL (ref 80.0–100.0)
Platelets: 271 10*3/uL (ref 150–400)
RBC: 4.51 MIL/uL (ref 3.87–5.11)
RDW: 14.8 % (ref 11.5–15.5)
WBC: 9.1 10*3/uL (ref 4.0–10.5)
nRBC: 0 % (ref 0.0–0.2)

## 2022-03-22 SURGERY — DACRYOCYSTORHINOSTOMY
Anesthesia: General | Site: Nose

## 2022-03-22 MED ORDER — ORAL CARE MOUTH RINSE
15.0000 mL | Freq: Once | OROMUCOSAL | Status: AC
Start: 2022-03-22 — End: 2022-03-22

## 2022-03-22 MED ORDER — ONDANSETRON HCL 4 MG/2ML IJ SOLN
INTRAMUSCULAR | Status: AC
  Filled 2022-03-22: qty 2

## 2022-03-22 MED ORDER — SODIUM CHLORIDE 0.9% FLUSH: 3.0000 mL | INTRAVENOUS | Status: DC | PRN

## 2022-03-22 MED ORDER — KETOROLAC TROMETHAMINE 15 MG/ML IJ SOLN: 15.0000 mg | Freq: Four times a day (QID) | INTRAMUSCULAR | Status: DC

## 2022-03-22 MED ORDER — SODIUM CHLORIDE 0.9% FLUSH: 3.0000 mL | Freq: Two times a day (BID) | INTRAVENOUS | Status: DC

## 2022-03-22 MED ORDER — HEMOSTATIC AGENTS (NO CHARGE) OPTIME
TOPICAL | Status: DC | PRN
  Administered 2022-03-22: 1 via TOPICAL

## 2022-03-22 MED ORDER — OXYCODONE HCL 5 MG PO TABS: 5.0000 mg | ORAL_TABLET | ORAL | Status: DC | PRN

## 2022-03-22 MED ORDER — OXYMETAZOLINE HCL 0.05 % NA SOLN
1.0000 | NASAL | Status: AC
  Administered 2022-03-22 (×3): 1 via NASAL
  Filled 2022-03-22: qty 30

## 2022-03-22 MED ORDER — ACETAMINOPHEN 500 MG PO TABS: 1000.0000 mg | ORAL_TABLET | Freq: Four times a day (QID) | ORAL | Status: DC

## 2022-03-22 MED ORDER — BACITRACIN-POLYMYXIN B 500-10000 UNIT/GM OP OINT
TOPICAL_OINTMENT | OPHTHALMIC | Status: DC | PRN
  Administered 2022-03-22: 1 via OPHTHALMIC
  Filled 2022-03-22: qty 3.5

## 2022-03-22 MED ORDER — LIDOCAINE 2% (20 MG/ML) 5 ML SYRINGE
INTRAMUSCULAR | Status: AC
  Filled 2022-03-22: qty 5

## 2022-03-22 MED ORDER — CHLORHEXIDINE GLUCONATE 0.12 % MT SOLN
OROMUCOSAL | Status: AC
  Administered 2022-03-22: 15 mL via OROMUCOSAL
  Filled 2022-03-22: qty 15

## 2022-03-22 MED ORDER — TRIAMCINOLONE ACETONIDE 40 MG/ML IJ SUSP
INTRAMUSCULAR | Status: DC | PRN
  Administered 2022-03-22: 20 mg

## 2022-03-22 MED ORDER — BACITRACIN-POLYMYXIN B 500-10000 UNIT/GM OP OINT
TOPICAL_OINTMENT | OPHTHALMIC | Status: AC
  Filled 2022-03-22: qty 3.5

## 2022-03-22 MED ORDER — BSS IO SOLN
INTRAOCULAR | Status: AC
  Filled 2022-03-22: qty 15

## 2022-03-22 MED ORDER — FENTANYL CITRATE (PF) 250 MCG/5ML IJ SOLN
INTRAMUSCULAR | Status: AC
  Filled 2022-03-22: qty 5

## 2022-03-22 MED ORDER — BUPIVACAINE HCL (PF) 0.75 % IJ SOLN
INTRAMUSCULAR | Status: AC
Start: 2022-03-22 — End: ?
  Filled 2022-03-22: qty 10

## 2022-03-22 MED ORDER — CHLORHEXIDINE GLUCONATE 0.12 % MT SOLN
15.0000 mL | Freq: Once | OROMUCOSAL | Status: AC
Start: 2022-03-22 — End: 2022-03-22

## 2022-03-22 MED ORDER — LIDOCAINE-EPINEPHRINE 1 %-1:100000 IJ SOLN
INTRAMUSCULAR | Status: DC | PRN
  Administered 2022-03-22: 2 mL

## 2022-03-22 MED ORDER — FENTANYL CITRATE (PF) 100 MCG/2ML IJ SOLN: 25.0000 ug | INTRAMUSCULAR | Status: DC | PRN

## 2022-03-22 MED ORDER — SODIUM CHLORIDE 0.9 % IV SOLN: INTRAVENOUS | Status: DC

## 2022-03-22 MED ORDER — BSS IO SOLN
INTRAOCULAR | Status: DC | PRN
  Administered 2022-03-22: 15 mL via INTRAOCULAR

## 2022-03-22 MED ORDER — PROPOFOL 10 MG/ML IV BOLUS
INTRAVENOUS | Status: AC
  Filled 2022-03-22: qty 20

## 2022-03-22 MED ORDER — OXYMETAZOLINE HCL 0.05 % NA SOLN
NASAL | Status: AC
  Filled 2022-03-22: qty 30

## 2022-03-22 MED ORDER — OXYMETAZOLINE HCL 0.05 % NA SOLN
NASAL | Status: DC | PRN
  Administered 2022-03-22: 1

## 2022-03-22 MED ORDER — ASPIRIN 325 MG PO TBEC: 325.0000 mg | DELAYED_RELEASE_TABLET | Freq: Every day | ORAL | Status: AC

## 2022-03-22 MED ORDER — HYDROCODONE-ACETAMINOPHEN 5-325 MG PO TABS
1.0000 | ORAL_TABLET | ORAL | 0 refills | Status: AC | PRN
Start: 2022-03-22 — End: 2023-03-22

## 2022-03-22 MED ORDER — TRIAMCINOLONE ACETONIDE 40 MG/ML IJ SUSP
INTRAMUSCULAR | Status: AC
  Filled 2022-03-22: qty 5

## 2022-03-22 MED ORDER — SODIUM CHLORIDE 0.9 % IV SOLN: 250.0000 mL | INTRAVENOUS | Status: DC | PRN

## 2022-03-22 MED ORDER — ONDANSETRON HCL 4 MG/2ML IJ SOLN
INTRAMUSCULAR | Status: DC | PRN
  Administered 2022-03-22: 4 mg via INTRAVENOUS

## 2022-03-22 MED ORDER — PROPOFOL 10 MG/ML IV BOLUS
INTRAVENOUS | Status: DC | PRN
  Administered 2022-03-22: 140 mg via INTRAVENOUS

## 2022-03-22 MED ORDER — NEOMYCIN-POLYMYXIN-DEXAMETH 3.5-10000-0.1 OP SUSP
1.0000 [drp] | Freq: Once | OPHTHALMIC | Status: AC
  Administered 2022-03-22: 1 [drp] via OPHTHALMIC
  Filled 2022-03-22 (×2): qty 5

## 2022-03-22 MED ORDER — FENTANYL CITRATE (PF) 250 MCG/5ML IJ SOLN
INTRAMUSCULAR | Status: DC | PRN
  Administered 2022-03-22: 50 ug via INTRAVENOUS
  Administered 2022-03-22: 25 ug via INTRAVENOUS

## 2022-03-22 MED ORDER — ACETAMINOPHEN 10 MG/ML IV SOLN: 1000.0000 mg | Freq: Once | INTRAVENOUS | Status: DC | PRN

## 2022-03-22 MED ORDER — GLYCOPYRROLATE 0.2 MG/ML IJ SOLN
INTRAMUSCULAR | Status: DC | PRN
  Administered 2022-03-22: .2 mg via INTRAVENOUS

## 2022-03-22 MED ORDER — 0.9 % SODIUM CHLORIDE (POUR BTL) OPTIME
TOPICAL | Status: DC | PRN
  Administered 2022-03-22: 1000 mL

## 2022-03-22 MED ORDER — ACETAMINOPHEN 325 MG PO TABS: 650.0000 mg | ORAL_TABLET | ORAL | Status: DC | PRN

## 2022-03-22 MED ORDER — ACETAMINOPHEN 650 MG RE SUPP: 650.0000 mg | RECTAL | Status: DC | PRN

## 2022-03-22 MED ORDER — LIDOCAINE 2% (20 MG/ML) 5 ML SYRINGE
INTRAMUSCULAR | Status: DC | PRN
  Administered 2022-03-22: 60 mg via INTRAVENOUS

## 2022-03-22 MED ORDER — LIDOCAINE-EPINEPHRINE 1 %-1:100000 IJ SOLN
INTRAMUSCULAR | Status: AC
  Filled 2022-03-22: qty 1

## 2022-03-22 MED ORDER — GLYCOPYRROLATE PF 0.2 MG/ML IJ SOSY
PREFILLED_SYRINGE | INTRAMUSCULAR | Status: AC
  Filled 2022-03-22: qty 1

## 2022-03-22 SURGICAL SUPPLY — 52 items
APL SWBSTK 6 STRL LF DISP (MISCELLANEOUS) ×2
APPLICATOR COTTON TIP 6 STRL (MISCELLANEOUS) ×2 IMPLANT
APPLICATOR COTTON TIP 6IN STRL (MISCELLANEOUS) ×3
BAG COUNTER SPONGE SURGICOUNT (BAG) ×3 IMPLANT
BAG SPNG CNTER NS LX DISP (BAG) ×2
BLADE SURG 15 STRL LF DISP TIS (BLADE) ×2 IMPLANT
BLADE SURG 15 STRL SS (BLADE) ×3
BNDG CMPR 5X2 CHSV 1 LYR STRL (GAUZE/BANDAGES/DRESSINGS)
BNDG COHESIVE 2X5 TAN ST LF (GAUZE/BANDAGES/DRESSINGS) IMPLANT
BUR DIAMOND COARSE 3.0 (BURR) IMPLANT
COAG SUCTION FOOTSWITCH 10FR (SUCTIONS) IMPLANT
COAGULATOR SUCT 8FR VV (MISCELLANEOUS) IMPLANT
CORD BIPOLAR FORCEPS 12FT (ELECTRODE) ×3 IMPLANT
COVER SURGICAL LIGHT HANDLE (MISCELLANEOUS) ×3 IMPLANT
DEFOGGER ANTIFOG KIT (MISCELLANEOUS) IMPLANT
DRAPE ORTHO SPLIT 77X108 STRL (DRAPES) ×3
DRAPE SURG ORHT 6 SPLT 77X108 (DRAPES) ×2 IMPLANT
ELECT NDL BLADE 2-5/6 (NEEDLE) IMPLANT
ELECT NEEDLE BLADE 2-5/6 (NEEDLE) IMPLANT
ELECT REM PT RETURN 9FT ADLT (ELECTROSURGICAL)
ELECTRODE REM PT RTRN 9FT ADLT (ELECTROSURGICAL) IMPLANT
FORCEPS BIPOLAR SPETZLER 8 1.0 (NEUROSURGERY SUPPLIES) ×3 IMPLANT
GAUZE 4X4 16PLY ~~LOC~~+RFID DBL (SPONGE) ×2 IMPLANT
GLOVE BIO SURGEON STRL SZ7.5 (GLOVE) ×6 IMPLANT
GLOVE SURG SYN 7.5  E (GLOVE) ×3
GLOVE SURG SYN 7.5 E (GLOVE) ×2 IMPLANT
GLOVE SURG SYN 7.5 PF PI (GLOVE) ×2 IMPLANT
GOWN STRL REUS W/ TWL LRG LVL3 (GOWN DISPOSABLE) ×4 IMPLANT
GOWN STRL REUS W/TWL LRG LVL3 (GOWN DISPOSABLE) ×6
KIT BASIN OR (CUSTOM PROCEDURE TRAY) ×3 IMPLANT
KNIFE CRESCENT 1.75 EDGEAHEAD (BLADE) ×3 IMPLANT
NDL PRECISIONGLIDE 27X1.5 (NEEDLE) ×4 IMPLANT
NEEDLE PRECISIONGLIDE 27X1.5 (NEEDLE) ×6 IMPLANT
NS IRRIG 1000ML POUR BTL (IV SOLUTION) ×3 IMPLANT
PACK CATARACT CUSTOM (CUSTOM PROCEDURE TRAY) ×3 IMPLANT
PAD ARMBOARD 7.5X6 YLW CONV (MISCELLANEOUS) ×6 IMPLANT
PATTIES SURGICAL .5 X3 (DISPOSABLE) ×3 IMPLANT
PENCIL BUTTON HOLSTER BLD 10FT (ELECTRODE) IMPLANT
SET INTBT LACRIMAL .016X.025 (DRAIN) ×3 IMPLANT
SPONGE SURGIFOAM ABS GEL 12-7 (HEMOSTASIS) ×3 IMPLANT
STAPLER VISISTAT (STAPLE) IMPLANT
SUT CHROMIC 4 0 S 4 (SUTURE) IMPLANT
SUT PLAIN 5 0 P 3 18 (SUTURE) IMPLANT
SUT PROLENE 6 0 CC (SUTURE) IMPLANT
SUT PROLENE 6 0 P 3 18 (SUTURE) ×3 IMPLANT
SUT VICRYL 6 0 S 14 UNDY (SUTURE) ×1 IMPLANT
SUT VICRYL 6 0 S 29 12 (SUTURE) IMPLANT
TOWEL GREEN STERILE FF (TOWEL DISPOSABLE) ×6 IMPLANT
TREPHINE OPHTH SISLER 21GA (BLADE) ×3 IMPLANT
TUBE CONNECTING 12X1/4 (SUCTIONS) ×3 IMPLANT
TUBING EXTENTION W/L.L. (IV SETS) IMPLANT
WATER STERILE IRR 1000ML POUR (IV SOLUTION) ×3 IMPLANT

## 2022-03-22 NOTE — Interval H&P Note (Signed)
I have reviewed the H&P from Scherrie November, NP on 03/08/22.  Patient remains appropriate for planned surgical procedure today.  Dairl Ponder, MD, PhD Ophthalmic Plastic and Reconstructive Surgery [facility] [phone] (office)

## 2022-03-23 ENCOUNTER — Encounter (HOSPITAL_COMMUNITY): Payer: Self-pay | Admitting: Optometry

## 2022-04-23 DIAGNOSIS — S0502XA Injury of conjunctiva and corneal abrasion without foreign body, left eye, initial encounter: Secondary | ICD-10-CM | POA: Diagnosis not present

## 2022-06-12 DIAGNOSIS — M1380 Other specified arthritis, unspecified site: Secondary | ICD-10-CM | POA: Diagnosis not present

## 2022-06-12 DIAGNOSIS — E785 Hyperlipidemia, unspecified: Secondary | ICD-10-CM | POA: Diagnosis not present

## 2022-06-12 DIAGNOSIS — M8580 Other specified disorders of bone density and structure, unspecified site: Secondary | ICD-10-CM | POA: Diagnosis not present

## 2022-06-12 DIAGNOSIS — I10 Essential (primary) hypertension: Secondary | ICD-10-CM | POA: Diagnosis not present

## 2022-06-17 DIAGNOSIS — H401132 Primary open-angle glaucoma, bilateral, moderate stage: Secondary | ICD-10-CM | POA: Diagnosis not present

## 2022-07-01 DIAGNOSIS — Z7952 Long term (current) use of systemic steroids: Secondary | ICD-10-CM | POA: Diagnosis not present

## 2022-07-01 DIAGNOSIS — M8580 Other specified disorders of bone density and structure, unspecified site: Secondary | ICD-10-CM | POA: Diagnosis not present

## 2022-07-01 DIAGNOSIS — M353 Polymyalgia rheumatica: Secondary | ICD-10-CM | POA: Diagnosis not present

## 2022-07-01 DIAGNOSIS — M1991 Primary osteoarthritis, unspecified site: Secondary | ICD-10-CM | POA: Diagnosis not present

## 2022-07-09 DIAGNOSIS — H524 Presbyopia: Secondary | ICD-10-CM | POA: Diagnosis not present

## 2022-07-10 ENCOUNTER — Other Ambulatory Visit (HOSPITAL_COMMUNITY): Payer: Self-pay | Admitting: *Deleted

## 2022-07-11 ENCOUNTER — Ambulatory Visit (HOSPITAL_COMMUNITY)
Admission: RE | Admit: 2022-07-11 | Discharge: 2022-07-11 | Disposition: A | Discharge status: Home / Self Care | Payer: Medicare Other | Source: Ambulatory Visit | Attending: Internal Medicine | Admitting: Internal Medicine

## 2022-07-11 DIAGNOSIS — E785 Hyperlipidemia, unspecified: Secondary | ICD-10-CM | POA: Insufficient documentation

## 2022-07-11 DIAGNOSIS — I6529 Occlusion and stenosis of unspecified carotid artery: Secondary | ICD-10-CM | POA: Insufficient documentation

## 2022-07-11 MED ORDER — INCLISIRAN SODIUM 284 MG/1.5ML ~~LOC~~ SOSY
PREFILLED_SYRINGE | SUBCUTANEOUS | Status: AC
  Filled 2022-07-11: qty 1.5

## 2022-07-11 MED ORDER — INCLISIRAN SODIUM 284 MG/1.5ML ~~LOC~~ SOSY
284.0000 mg | PREFILLED_SYRINGE | Freq: Once | SUBCUTANEOUS | Status: AC
  Administered 2022-07-11: 284 mg via SUBCUTANEOUS

## 2022-07-15 DIAGNOSIS — M353 Polymyalgia rheumatica: Secondary | ICD-10-CM | POA: Diagnosis not present

## 2022-07-15 DIAGNOSIS — I10 Essential (primary) hypertension: Secondary | ICD-10-CM | POA: Diagnosis not present

## 2022-07-15 DIAGNOSIS — R519 Headache, unspecified: Secondary | ICD-10-CM | POA: Diagnosis not present

## 2022-07-15 DIAGNOSIS — R002 Palpitations: Secondary | ICD-10-CM | POA: Diagnosis not present

## 2022-07-16 ENCOUNTER — Other Ambulatory Visit: Payer: Self-pay | Admitting: Family Medicine

## 2022-07-16 DIAGNOSIS — R519 Headache, unspecified: Secondary | ICD-10-CM

## 2022-07-16 DIAGNOSIS — M353 Polymyalgia rheumatica: Secondary | ICD-10-CM

## 2022-07-22 ENCOUNTER — Ambulatory Visit
Admission: RE | Admit: 2022-07-22 | Discharge: 2022-07-22 | Disposition: A | Discharge status: Home / Self Care | Payer: Medicare Other | Source: Ambulatory Visit | Attending: Family Medicine | Admitting: Family Medicine

## 2022-07-22 DIAGNOSIS — R519 Headache, unspecified: Secondary | ICD-10-CM | POA: Diagnosis not present

## 2022-07-22 DIAGNOSIS — M353 Polymyalgia rheumatica: Secondary | ICD-10-CM

## 2022-09-10 DIAGNOSIS — N8110 Cystocele, unspecified: Secondary | ICD-10-CM | POA: Diagnosis not present

## 2022-09-10 DIAGNOSIS — L03113 Cellulitis of right upper limb: Secondary | ICD-10-CM | POA: Diagnosis not present

## 2022-09-10 DIAGNOSIS — N390 Urinary tract infection, site not specified: Secondary | ICD-10-CM | POA: Diagnosis not present

## 2022-09-10 DIAGNOSIS — M1380 Other specified arthritis, unspecified site: Secondary | ICD-10-CM | POA: Diagnosis not present

## 2022-09-17 DIAGNOSIS — N390 Urinary tract infection, site not specified: Secondary | ICD-10-CM | POA: Diagnosis not present

## 2022-09-19 DIAGNOSIS — R1030 Lower abdominal pain, unspecified: Secondary | ICD-10-CM | POA: Diagnosis not present

## 2022-09-19 DIAGNOSIS — N952 Postmenopausal atrophic vaginitis: Secondary | ICD-10-CM | POA: Diagnosis not present

## 2022-09-19 DIAGNOSIS — N812 Incomplete uterovaginal prolapse: Secondary | ICD-10-CM | POA: Diagnosis not present

## 2022-10-10 ENCOUNTER — Other Ambulatory Visit (HOSPITAL_COMMUNITY): Payer: Self-pay | Admitting: *Deleted

## 2022-10-11 ENCOUNTER — Ambulatory Visit (HOSPITAL_COMMUNITY)
Admission: RE | Admit: 2022-10-11 | Discharge: 2022-10-11 | Disposition: A | Discharge status: Home / Self Care | Payer: Medicare Other | Source: Ambulatory Visit | Attending: Internal Medicine | Admitting: Internal Medicine

## 2022-10-11 DIAGNOSIS — Z8673 Personal history of transient ischemic attack (TIA), and cerebral infarction without residual deficits: Secondary | ICD-10-CM | POA: Diagnosis not present

## 2022-10-11 DIAGNOSIS — N8110 Cystocele, unspecified: Secondary | ICD-10-CM | POA: Diagnosis not present

## 2022-10-11 DIAGNOSIS — I6529 Occlusion and stenosis of unspecified carotid artery: Secondary | ICD-10-CM | POA: Insufficient documentation

## 2022-10-11 DIAGNOSIS — E785 Hyperlipidemia, unspecified: Secondary | ICD-10-CM | POA: Diagnosis not present

## 2022-10-11 DIAGNOSIS — R102 Pelvic and perineal pain: Secondary | ICD-10-CM | POA: Diagnosis not present

## 2022-10-11 MED ORDER — INCLISIRAN SODIUM 284 MG/1.5ML ~~LOC~~ SOSY
PREFILLED_SYRINGE | SUBCUTANEOUS | Status: AC
  Filled 2022-10-11: qty 1.5

## 2022-10-11 MED ORDER — INCLISIRAN SODIUM 284 MG/1.5ML ~~LOC~~ SOSY
284.0000 mg | PREFILLED_SYRINGE | Freq: Once | SUBCUTANEOUS | Status: AC
  Administered 2022-10-11: 284 mg via SUBCUTANEOUS

## 2022-10-21 DIAGNOSIS — M545 Low back pain, unspecified: Secondary | ICD-10-CM | POA: Diagnosis not present

## 2022-10-21 DIAGNOSIS — M7061 Trochanteric bursitis, right hip: Secondary | ICD-10-CM | POA: Diagnosis not present

## 2022-10-25 DIAGNOSIS — M47896 Other spondylosis, lumbar region: Secondary | ICD-10-CM | POA: Diagnosis not present

## 2022-10-25 DIAGNOSIS — M25551 Pain in right hip: Secondary | ICD-10-CM | POA: Diagnosis not present

## 2022-11-05 DIAGNOSIS — M5416 Radiculopathy, lumbar region: Secondary | ICD-10-CM | POA: Diagnosis not present

## 2022-11-05 DIAGNOSIS — M545 Low back pain, unspecified: Secondary | ICD-10-CM | POA: Diagnosis not present

## 2022-11-07 DIAGNOSIS — M545 Low back pain, unspecified: Secondary | ICD-10-CM | POA: Diagnosis not present

## 2022-11-07 DIAGNOSIS — M5416 Radiculopathy, lumbar region: Secondary | ICD-10-CM | POA: Diagnosis not present

## 2022-11-11 DIAGNOSIS — M545 Low back pain, unspecified: Secondary | ICD-10-CM | POA: Diagnosis not present

## 2022-11-11 DIAGNOSIS — M5416 Radiculopathy, lumbar region: Secondary | ICD-10-CM | POA: Diagnosis not present

## 2022-11-13 DIAGNOSIS — R42 Dizziness and giddiness: Secondary | ICD-10-CM | POA: Diagnosis not present

## 2022-11-14 DIAGNOSIS — M5136 Other intervertebral disc degeneration, lumbar region: Secondary | ICD-10-CM | POA: Diagnosis not present

## 2022-11-18 DIAGNOSIS — M545 Low back pain, unspecified: Secondary | ICD-10-CM | POA: Diagnosis not present

## 2022-11-18 DIAGNOSIS — M5416 Radiculopathy, lumbar region: Secondary | ICD-10-CM | POA: Diagnosis not present

## 2022-11-21 DIAGNOSIS — M5416 Radiculopathy, lumbar region: Secondary | ICD-10-CM | POA: Diagnosis not present

## 2022-11-21 DIAGNOSIS — M545 Low back pain, unspecified: Secondary | ICD-10-CM | POA: Diagnosis not present

## 2022-11-28 DIAGNOSIS — M545 Low back pain, unspecified: Secondary | ICD-10-CM | POA: Diagnosis not present

## 2022-12-06 DIAGNOSIS — M8438XD Stress fracture, other site, subsequent encounter for fracture with routine healing: Secondary | ICD-10-CM | POA: Diagnosis not present

## 2022-12-06 DIAGNOSIS — E785 Hyperlipidemia, unspecified: Secondary | ICD-10-CM | POA: Diagnosis not present

## 2022-12-06 DIAGNOSIS — I700 Atherosclerosis of aorta: Secondary | ICD-10-CM | POA: Diagnosis not present

## 2022-12-09 ENCOUNTER — Other Ambulatory Visit (HOSPITAL_COMMUNITY): Payer: Self-pay | Admitting: Interventional Radiology

## 2022-12-09 ENCOUNTER — Telehealth (HOSPITAL_COMMUNITY): Payer: Self-pay

## 2022-12-09 DIAGNOSIS — Z961 Presence of intraocular lens: Secondary | ICD-10-CM | POA: Diagnosis not present

## 2022-12-09 DIAGNOSIS — M8448XA Pathological fracture, other site, initial encounter for fracture: Secondary | ICD-10-CM

## 2022-12-09 DIAGNOSIS — H401132 Primary open-angle glaucoma, bilateral, moderate stage: Secondary | ICD-10-CM | POA: Diagnosis not present

## 2022-12-09 NOTE — Telephone Encounter (Signed)
Ok per Dr. Estanislado Pandy for bilateral sacroplasty. AB

## 2022-12-16 ENCOUNTER — Other Ambulatory Visit: Payer: Self-pay | Admitting: Student

## 2022-12-16 DIAGNOSIS — Z419 Encounter for procedure for purposes other than remedying health state, unspecified: Secondary | ICD-10-CM

## 2022-12-16 NOTE — H&P (Incomplete)
Chief Complaint: Patient was seen in consultation today for bilateral sacroplasty at the request of Benedetto Goad, NP.  Referring Physician(s): Benedetto Goad, NP. Rosanne Gutting)  Supervising Physician: Luanne Bras  Patient Status: [facility] - Out-pt  History of Present Illness: Judith [name] is a 77 y.o. female with PMHs of HTN, HLD, PVC, OA, osteopenia and symptomatic bilateral sacral insufficiency fx who presents for bilateral sacroplasty.   Patient has been followed by [facility] due to low back pain, has tried PT and Meloxicam, Aleve without relief. Patient underwent MR L spine which showed bilateral sacral insufficiency fx, she was referred to Dr. Estanislado Pandy for possible sacroplasty. Imaging reviewed by Dr. Estanislado Pandy and patient was deemed a candidate for bilateral scroplasty. Insurance approval has received, patient presents to [place] today for image guided bilateral sacroplaty.   ***  Past Medical History:  Diagnosis Date   Arthritis    Carotid bruit    Cervical spondylosis    Elevated lipids    GERD (gastroesophageal reflux disease)    Glaucoma 2011   Hyperlipidemia    Hypertension    Osteoarthritis    Osteopenia    RBBB (right bundle branch block)    Symptomatic PVCs    Thoracic scoliosis    since teenager   Vertigo     Past Surgical History:  Procedure Laterality Date   BREAST BIOPSY Left    10+ yrs ago, benign   CATARACT EXTRACTION Bilateral 2014   done 3 weeks apart   DACRORHINOCYSTOTOMY Left 03/22/2022   Procedure: DACROCYSTORHINOSTOMY (DCR);  Surgeon: Delia Chimes, MD;  Location: [place];  Service: Ophthalmology;  Laterality: Left;   LACRIMAL DUCT EXPLORATION N/A 03/22/2022   Procedure: LACRIMAL DUCT EXPLORATION;  Surgeon: Delia Chimes, MD;  Location: [place];  Service: Ophthalmology;  Laterality: N/A;   LACRIMAL TUBE INSERTION Left 03/22/2022   Procedure: CRAWFIRD STENT INSERTION;  Surgeon: Delia Chimes, MD;  Location: [place];   Service: Ophthalmology;  Laterality: Left;   OOPHORECTOMY Left 1969   secondary to dermoid   ROTATOR CUFF REPAIR Right 09/2002    Allergies: Erythromycin, Penicillins, Plavix [clopidogrel bisulfate], and Sulfa antibiotics  Medications: Prior to Admission medications   Medication Sig Start Date End Date Taking? Authorizing Provider  aspirin EC 325 MG tablet Take 1 tablet (325 mg total) by mouth daily. 03/24/22   Delia Chimes, MD  b complex vitamins capsule Take 1 capsule by mouth daily.    [provider]  cholecalciferol (VITAMIN D3) 25 MCG (1000 UNIT) tablet Take 1,000 Units by mouth daily.    [provider]  ezetimibe (ZETIA) 10 MG tablet Take 10 mg by mouth at bedtime. 01/21/22   [provider]  HYDROcodone-acetaminophen (NORCO/VICODIN) 5-325 MG tablet Take 1 tablet by mouth every 4 (four) hours as needed for moderate pain. 03/22/22 03/22/23  Delia Chimes, MD  ibandronate (BONIVA) 150 MG tablet TAKE 1 TABLET BY MOUTH ONCE A MONTH 04/17/19   [provider]  irbesartan (AVAPRO) 75 MG tablet Take 37.5 mg by mouth at bedtime. 02/14/22   [provider]  latanoprost (XALATAN) 0.005 % ophthalmic solution Place 1 drop into both eyes at bedtime.      [provider]  Multiple Vitamins-Minerals (PRESERVISION AREDS 2+MULTI VIT PO) Take 1 Capful by mouth daily.    [provider]  simvastatin (ZOCOR) 40 MG tablet Take 40 mg by mouth daily. 03/30/19   [provider]  timolol (TIMOPTIC) 0.5 % ophthalmic solution Place 1 drop into both  eyes every morning. 12/25/21   [provider]     Family History  Problem Relation Age of Onset   Cancer Mother 28       throat   Hypertension Father    Heart disease Father 37   Prostate cancer Brother 77   Hypertension Brother    Liver cancer Maternal Aunt    Cancer Maternal Aunt    Stomach cancer Maternal Grandfather    Cancer Maternal Grandfather    CAD Maternal Uncle      Social History   Socioeconomic History   Marital status: Married    Spouse name: Ed   Number of children: 2   Years of education: 14   Highest education level: Not on file  Occupational History   Occupation: LPN    Employer: [employer]: retired  Tobacco Use   Smoking status: Former    Packs/day: 1.00    Years: 20.00    Additional pack years: 0.00    Total pack years: 20.00    Types: Cigarettes    Quit date: 09/30/1978    Years since quitting: 44.2   Smokeless tobacco: Never  Vaping Use   Vaping Use: Never used  Substance and Sexual Activity   Alcohol use: Not Currently    Alcohol/week: 1.0 - 2.0 standard drink of alcohol    Types: 1 - 2 Standard drinks or equivalent per week    Comment: occassionally wine   Drug use: No   Sexual activity: Yes    Partners: Male  Other Topics Concern   Not on file  Social History Narrative   Works out at [employer]   Former smoker   Occasional ETOH   Married   Caffeine 1 daily   Social Determinants of Radio broadcast assistant Strain: Not on file  Food Insecurity: Not on file  Transportation Needs: Not on file  Physical Activity: Not on file  Stress: Not on file  Social Connections: Not on file     Review of Systems: A 12 point ROS discussed and pertinent positives are indicated in the HPI above.  All other systems are negative.  Vital Signs: LMP 09/30/1993   *** Physical Exam     Imaging: No results found.  Labs:  CBC: Recent Labs    03/22/22 0856  WBC 9.1  HGB 14.2  HCT 43.3  PLT 271    COAGS: No results for input(s): "INR", "APTT" in the last 8760 hours.  BMP: Recent Labs    03/22/22 0856  NA SPECIMEN HEMOLYZED. HEMOLYSIS MAY AFFECT INTEGRITY OF RESULTS.  K SPECIMEN HEMOLYZED. HEMOLYSIS MAY AFFECT INTEGRITY OF RESULTS.  CL SPECIMEN HEMOLYZED. HEMOLYSIS MAY AFFECT INTEGRITY OF RESULTS.  CO2 SPECIMEN HEMOLYZED. HEMOLYSIS MAY AFFECT INTEGRITY OF RESULTS.  GLUCOSE  SPECIMEN HEMOLYZED. HEMOLYSIS MAY AFFECT INTEGRITY OF RESULTS.  BUN SPECIMEN HEMOLYZED. HEMOLYSIS MAY AFFECT INTEGRITY OF RESULTS.  CALCIUM SPECIMEN HEMOLYZED. HEMOLYSIS MAY AFFECT INTEGRITY OF RESULTS.  CREATININE SPECIMEN HEMOLYZED. HEMOLYSIS MAY AFFECT INTEGRITY OF RESULTS.  GFRNONAA SPECIMEN HEMOLYZED. HEMOLYSIS MAY AFFECT INTEGRITY OF RESULTS.  GFRAA SPECIMEN HEMOLYZED. HEMOLYSIS MAY AFFECT INTEGRITY OF RESULTS.    LIVER FUNCTION TESTS: No results for input(s): "BILITOT", "AST", "ALT", "ALKPHOS", "PROT", "ALBUMIN" in the last 8760 hours.  TUMOR MARKERS: No results for input(s): "AFPTM", "CEA", "CA199", "CHROMGRNA" in the last 8760 hours.  Assessment and Plan: 77 y.o. female with low back pain and bilateral sacral insufficiency fx who presents for bilateral sacroplasty with Dr.  Deveshwar.   NPO since MN VSS CBC with diff *** BMP *** INR *** On ASA 325 mg, has been held *** days   Risks and benefits of sacroplasty were discussed with the patient including, but not limited to education regarding the natural healing process of compression fractures without intervention, bleeding, infection, cement migration which may cause spinal cord damage, paralysis, pulmonary embolism or even death.  This interventional procedure involves the use of X-rays and because of the nature of the planned procedure, it is possible that we will have prolonged use of X-ray fluoroscopy.  Potential radiation risks to you include (but are not limited to) the following: - A slightly elevated risk for cancer  several years later in life. This risk is typically less than 0.5% percent. This risk is low in comparison to the normal incidence of human cancer, which is 33% for women and 50% for men according to the [place]. - Radiation induced injury can include skin redness, resembling a rash, tissue breakdown / ulcers and hair loss (which can be temporary or permanent).   The likelihood of either  of these occurring depends on the difficulty of the procedure and whether you are sensitive to radiation due to previous procedures, disease, or genetic conditions.   IF your procedure requires a prolonged use of radiation, you will be notified and given written instructions for further action.  It is your responsibility to monitor the irradiated area for the 2 weeks following the procedure and to notify your physician if you are concerned that you have suffered a radiation induced injury.    All of the patient's questions were answered, patient is agreeable to proceed.  Consent signed and in chart.    Thank you for this interesting consult.  I greatly enjoyed meeting Hisayo Christine Isbell and look forward to participating in their care.  A copy of this report was sent to the requesting provider on this date.  Electronically Signed: Tera Mater, PA-C 12/16/2022, 3:50 PM   I spent a total of  30 Minutes   in face to face in clinical consultation, greater than 50% of which was counseling/coordinating care for bilateral sacroplasty.  This chart was dictated using voice recognition software.  Despite best efforts to proofread,  errors can occur which can change the documentation meaning.

## 2022-12-17 ENCOUNTER — Ambulatory Visit (HOSPITAL_COMMUNITY)
Admission: RE | Admit: 2022-12-17 | Discharge: 2022-12-17 | Disposition: A | Discharge status: Home / Self Care | Payer: Medicare Other | Source: Ambulatory Visit | Attending: Interventional Radiology | Admitting: Interventional Radiology

## 2022-12-17 ENCOUNTER — Other Ambulatory Visit: Payer: Self-pay

## 2022-12-17 ENCOUNTER — Other Ambulatory Visit: Payer: Self-pay | Admitting: Student

## 2022-12-17 ENCOUNTER — Encounter (HOSPITAL_COMMUNITY): Payer: Self-pay

## 2022-12-17 DIAGNOSIS — I493 Ventricular premature depolarization: Secondary | ICD-10-CM | POA: Insufficient documentation

## 2022-12-17 DIAGNOSIS — M8580 Other specified disorders of bone density and structure, unspecified site: Secondary | ICD-10-CM | POA: Insufficient documentation

## 2022-12-17 DIAGNOSIS — M8448XA Pathological fracture, other site, initial encounter for fracture: Secondary | ICD-10-CM | POA: Diagnosis not present

## 2022-12-17 DIAGNOSIS — E785 Hyperlipidemia, unspecified: Secondary | ICD-10-CM | POA: Diagnosis not present

## 2022-12-17 DIAGNOSIS — M1990 Unspecified osteoarthritis, unspecified site: Secondary | ICD-10-CM | POA: Diagnosis not present

## 2022-12-17 DIAGNOSIS — Z87891 Personal history of nicotine dependence: Secondary | ICD-10-CM | POA: Diagnosis not present

## 2022-12-17 DIAGNOSIS — I10 Essential (primary) hypertension: Secondary | ICD-10-CM | POA: Diagnosis not present

## 2022-12-17 DIAGNOSIS — Z419 Encounter for procedure for purposes other than remedying health state, unspecified: Secondary | ICD-10-CM

## 2022-12-17 DIAGNOSIS — M4627 Osteomyelitis of vertebra, lumbosacral region: Secondary | ICD-10-CM | POA: Diagnosis not present

## 2022-12-17 HISTORY — PX: IR SACROPLASTY BILATERAL: IMG5561

## 2022-12-17 LAB — CBC WITH DIFFERENTIAL/PLATELET
Abs Immature Granulocytes: 0.01 10*3/uL (ref 0.00–0.07)
Basophils Absolute: 0 10*3/uL (ref 0.0–0.1)
Basophils Relative: 0 %
Eosinophils Absolute: 0.2 10*3/uL (ref 0.0–0.5)
Eosinophils Relative: 3 %
HCT: 39.5 % (ref 36.0–46.0)
Hemoglobin: 12.9 g/dL (ref 12.0–15.0)
Immature Granulocytes: 0 %
Lymphocytes Relative: 16 %
Lymphs Abs: 1.1 10*3/uL (ref 0.7–4.0)
MCH: 30.3 pg (ref 26.0–34.0)
MCHC: 32.7 g/dL (ref 30.0–36.0)
MCV: 92.7 fL (ref 80.0–100.0)
Monocytes Absolute: 0.5 10*3/uL (ref 0.1–1.0)
Monocytes Relative: 8 %
Neutro Abs: 4.9 10*3/uL (ref 1.7–7.7)
Neutrophils Relative %: 73 %
Platelets: 389 10*3/uL (ref 150–400)
RBC: 4.26 MIL/uL (ref 3.87–5.11)
RDW: 13.1 % (ref 11.5–15.5)
WBC: 6.7 10*3/uL (ref 4.0–10.5)
nRBC: 0 % (ref 0.0–0.2)

## 2022-12-17 LAB — BASIC METABOLIC PANEL
Anion gap: 11 (ref 5–15)
BUN: 16 mg/dL (ref 8–23)
CO2: 23 mmol/L (ref 22–32)
Calcium: 8.8 mg/dL — ABNORMAL LOW (ref 8.9–10.3)
Chloride: 101 mmol/L (ref 98–111)
Creatinine, Ser: 0.92 mg/dL (ref 0.44–1.00)
GFR, Estimated: >60 mL/min (ref >60)
Glucose, Bld: 114 mg/dL — ABNORMAL HIGH (ref 70–99)
Potassium: 3.4 mmol/L — ABNORMAL LOW (ref 3.5–5.1)
Sodium: 135 mmol/L (ref 135–145)

## 2022-12-17 LAB — PROTIME-INR
INR: 1 (ref 0.8–1.2)
Prothrombin Time: 13.2 seconds (ref 11.4–15.2)

## 2022-12-17 MED ORDER — MIDAZOLAM HCL 2 MG/2ML IJ SOLN
INTRAMUSCULAR | Status: AC | PRN
  Administered 2022-12-17: 1 mg via INTRAVENOUS
  Administered 2022-12-17: .5 mg via INTRAVENOUS

## 2022-12-17 MED ORDER — BUPIVACAINE HCL (PF) 0.5 % IJ SOLN
INTRAMUSCULAR | Status: AC
  Filled 2022-12-17: qty 30

## 2022-12-17 MED ORDER — VANCOMYCIN HCL IN DEXTROSE 1-5 GM/200ML-% IV SOLN
1000.0000 mg | INTRAVENOUS | Status: AC
  Administered 2022-12-17: 1000 mg via INTRAVENOUS

## 2022-12-17 MED ORDER — FENTANYL CITRATE (PF) 100 MCG/2ML IJ SOLN
INTRAMUSCULAR | Status: AC
  Filled 2022-12-17: qty 4

## 2022-12-17 MED ORDER — VANCOMYCIN HCL IN DEXTROSE 1-5 GM/200ML-% IV SOLN: 1000.0000 mg | INTRAVENOUS | Status: DC

## 2022-12-17 MED ORDER — FENTANYL CITRATE (PF) 100 MCG/2ML IJ SOLN
INTRAMUSCULAR | Status: AC | PRN
  Administered 2022-12-17 (×3): 25 ug via INTRAVENOUS

## 2022-12-17 MED ORDER — SODIUM CHLORIDE 0.9 % IV SOLN: INTRAVENOUS | Status: DC

## 2022-12-17 MED ORDER — MIDAZOLAM HCL 2 MG/2ML IJ SOLN
INTRAMUSCULAR | Status: AC
  Filled 2022-12-17: qty 4

## 2022-12-17 MED ORDER — IOHEXOL 300 MG/ML  SOLN
50.0000 mL | Freq: Once | INTRAMUSCULAR | Status: AC | PRN
  Administered 2022-12-17: 10 mL

## 2022-12-17 MED ORDER — TOBRAMYCIN SULFATE 1.2 G IJ SOLR
INTRAMUSCULAR | Status: AC
  Filled 2022-12-17: qty 1.2

## 2022-12-17 MED ORDER — VANCOMYCIN HCL IN DEXTROSE 1-5 GM/200ML-% IV SOLN
INTRAVENOUS | Status: AC
  Filled 2022-12-17: qty 200

## 2022-12-17 MED ORDER — SODIUM CHLORIDE 0.9 % IV SOLN: INTRAVENOUS | Status: AC

## 2022-12-17 NOTE — Progress Notes (Signed)
Molli Hazard, PA notified client ate donut at 0600 and per Aimee procedure cancelled for today

## 2022-12-17 NOTE — Progress Notes (Signed)
Pt ate at 0600 this morning. Dr Estanislado Pandy cancelled case, to be rescheduled. No iv started, vitals and just some questions.

## 2022-12-17 NOTE — Procedures (Signed)
INR.  Status post sacral vertebroplasty  with biopsy under fluoroscopic guidance.    Bilateral approach.  No acute complications.  Hemostasis achieved at the skin entry sites.  Patient tolerated the  procedure well.  Arlean Hopping MD.

## 2022-12-17 NOTE — Discharge Instructions (Addendum)
INR.  1. No stooping, bending or  lifting weights above 10 pounds for 2 weeks.  2.  Use a walker to ambulate for 2 weeks.  3. No driving for 2 weeks.  4.  See as needed in 2 weeks.  5.  Check with referring MDs office regarding the biopsy reports.  Arlean Hopping MD

## 2022-12-17 NOTE — H&P (Cosign Needed Addendum)
Chief Complaint: Patient was seen in consultation today for bilateral sacroplasty at the request of Benedetto Goad, NP.  Referring Physician(s): Benedetto Goad, NP. Rosanne Gutting)  Supervising Physician: Luanne Bras  Patient Status: [facility] - Out-pt  History of Present Illness: Judith [name] is a 77 y.o. female with PMHs of HTN, HLD, PVC, OA, osteopenia and symptomatic bilateral sacral insufficiency fx who presents for bilateral sacroplasty.   Patient has been followed by [facility] due to low back pain, has tried PT and Meloxicam, Aleve without relief. Patient underwent MR L spine which showed bilateral sacral insufficiency fx, she was referred to Dr. Estanislado Pandy for possible sacroplasty. Imaging reviewed by Dr. Estanislado Pandy and patient was deemed a candidate for bilateral scroplasty. Insurance approval has received, patient presents to [place] today for image guided bilateral sacroplaty.   Ms. Russak seen with her husband in short stay, she reports having a donut this morning around 0600 but otherwise has not had anything to eat or drink since that time. She states her pain is in her very low back and started out of nowhere in January, she did not have any falls or other trauma. She was surprised to find she had sacral fractures when they were discovered. She has tried tramadol which made her sick and did not help much with the pain, she currently takes Tylenol which helps slightly. The pain limits her ability to do ADLs such as household chores, cooking, driving etc. She uses a cane or walker to get around the house and when she goes out to the store because the pain is so severe. She understands the procedure today and is agreeable to proceed.  Past Medical History:  Diagnosis Date   Arthritis    Carotid bruit    Cervical spondylosis    Elevated lipids    GERD (gastroesophageal reflux disease)    Glaucoma 2011   Hyperlipidemia    Hypertension    Osteoarthritis     Osteopenia    RBBB (right bundle branch block)    Symptomatic PVCs    Thoracic scoliosis    since teenager   Vertigo     Past Surgical History:  Procedure Laterality Date   BREAST BIOPSY Left    10+ yrs ago, benign   CATARACT EXTRACTION Bilateral 2014   done 3 weeks apart   DACRORHINOCYSTOTOMY Left 03/22/2022   Procedure: DACROCYSTORHINOSTOMY (DCR);  Surgeon: Delia Chimes, MD;  Location: [place];  Service: Ophthalmology;  Laterality: Left;   LACRIMAL DUCT EXPLORATION N/A 03/22/2022   Procedure: LACRIMAL DUCT EXPLORATION;  Surgeon: Delia Chimes, MD;  Location: [place];  Service: Ophthalmology;  Laterality: N/A;   LACRIMAL TUBE INSERTION Left 03/22/2022   Procedure: CRAWFIRD STENT INSERTION;  Surgeon: Delia Chimes, MD;  Location: [place];  Service: Ophthalmology;  Laterality: Left;   OOPHORECTOMY Left 1969   secondary to dermoid   ROTATOR CUFF REPAIR Right 09/2002    Allergies: Erythromycin, Penicillins, Plavix [clopidogrel bisulfate], and Sulfa antibiotics  Medications: Prior to Admission medications   Medication Sig Start Date End Date Taking? Authorizing Provider  aspirin EC 325 MG tablet Take 1 tablet (325 mg total) by mouth daily. 03/24/22   Delia Chimes, MD  b complex vitamins capsule Take 1 capsule by mouth daily.    [provider]  cholecalciferol (VITAMIN D3) 25 MCG (1000 UNIT) tablet Take 1,000 Units by mouth daily.    [provider]  ezetimibe (ZETIA) 10 MG tablet Take 10 mg by mouth at bedtime. 01/21/22  [provider]  HYDROcodone-acetaminophen (NORCO/VICODIN) 5-325 MG tablet Take 1 tablet by mouth every 4 (four) hours as needed for moderate pain. 03/22/22 03/22/23  Delia Chimes, MD  ibandronate (BONIVA) 150 MG tablet TAKE 1 TABLET BY MOUTH ONCE A MONTH 04/17/19   [provider]  irbesartan (AVAPRO) 75 MG tablet Take 37.5 mg by mouth at bedtime. 02/14/22   [provider]  latanoprost (XALATAN) 0.005 % ophthalmic  solution Place 1 drop into both eyes at bedtime.      [provider]  Multiple Vitamins-Minerals (PRESERVISION AREDS 2+MULTI VIT PO) Take 1 Capful by mouth daily.    [provider]  simvastatin (ZOCOR) 40 MG tablet Take 40 mg by mouth daily. 03/30/19   [provider]  timolol (TIMOPTIC) 0.5 % ophthalmic solution Place 1 drop into both eyes every morning. 12/25/21   [provider]     Family History  Problem Relation Age of Onset   Cancer Mother 90       throat   Hypertension Father    Heart disease Father 46   Prostate cancer Brother 34   Hypertension Brother    Liver cancer Maternal Aunt    Cancer Maternal Aunt    Stomach cancer Maternal Grandfather    Cancer Maternal Grandfather    CAD Maternal Uncle     Social History   Socioeconomic History   Marital status: Married    Spouse name: Ed   Number of children: 2   Years of education: 14   Highest education level: Not on file  Occupational History   Occupation: LPN    Employer: [employer]: retired  Tobacco Use   Smoking status: Former    Packs/day: 1.00    Years: 20.00    Additional pack years: 0.00    Total pack years: 20.00    Types: Cigarettes    Quit date: 09/30/1978    Years since quitting: 44.2   Smokeless tobacco: Never  Vaping Use   Vaping Use: Never used  Substance and Sexual Activity   Alcohol use: Not Currently    Alcohol/week: 1.0 - 2.0 standard drink of alcohol    Types: 1 - 2 Standard drinks or equivalent per week    Comment: occassionally wine   Drug use: No   Sexual activity: Yes    Partners: Male  Other Topics Concern   Not on file  Social History Narrative   Works out at [employer]   Former smoker   Occasional ETOH   Married   Caffeine 1 daily   Social Determinants of Radio broadcast assistant Strain: Not on file  Food Insecurity: Not on file  Transportation Needs: Not on file  Physical Activity: Not on file  Stress:  Not on file  Social Connections: Not on file     Review of Systems: A 12 point ROS discussed and pertinent positives are indicated in the HPI above.  All other systems are negative.  Vital Signs: BP (!) 162/83   Pulse 79   Temp (!) 97.2 F (36.2 C) (Tympanic)   Resp 17   Ht 5\' 4"  (1.626 m)   Wt 148 lb (67.1 kg)   LMP 09/30/1993   SpO2 100%   BMI 25.40 kg/m    Physical Exam Vitals reviewed.  Constitutional:      General: She is not in acute distress. HENT:     Head: Normocephalic.     Mouth/Throat:  Mouth: Mucous membranes are moist.     Pharynx: Oropharynx is clear. No oropharyngeal exudate or posterior oropharyngeal erythema.  Cardiovascular:     Rate and Rhythm: Normal rate and regular rhythm.  Pulmonary:     Effort: Pulmonary effort is normal.     Breath sounds: Normal breath sounds.  Abdominal:     General: There is no distension.     Palpations: Abdomen is soft.  Skin:    General: Skin is warm and dry.  Neurological:     Mental Status: She is alert and oriented to person, place, and time.  Psychiatric:        Mood and Affect: Mood normal.        Behavior: Behavior normal.        Thought Content: Thought content normal.        Judgment: Judgment normal.     MD Evaluation Airway: WNL Heart: WNL Abdomen: WNL Chest/ Lungs: WNL ASA  Classification: 3 Mallampati/Airway Score: Two  Imaging: No results found.  Labs:  CBC: Recent Labs    03/22/22 0856 12/17/22 1140  WBC 9.1 6.7  HGB 14.2 12.9  HCT 43.3 39.5  PLT 271 389    COAGS: Recent Labs    12/17/22 1032  INR 1.0    BMP: Recent Labs    03/22/22 0856  NA SPECIMEN HEMOLYZED. HEMOLYSIS MAY AFFECT INTEGRITY OF RESULTS.  K SPECIMEN HEMOLYZED. HEMOLYSIS MAY AFFECT INTEGRITY OF RESULTS.  CL SPECIMEN HEMOLYZED. HEMOLYSIS MAY AFFECT INTEGRITY OF RESULTS.  CO2 SPECIMEN HEMOLYZED. HEMOLYSIS MAY AFFECT INTEGRITY OF RESULTS.  GLUCOSE SPECIMEN HEMOLYZED. HEMOLYSIS MAY AFFECT INTEGRITY OF  RESULTS.  BUN SPECIMEN HEMOLYZED. HEMOLYSIS MAY AFFECT INTEGRITY OF RESULTS.  CALCIUM SPECIMEN HEMOLYZED. HEMOLYSIS MAY AFFECT INTEGRITY OF RESULTS.  CREATININE SPECIMEN HEMOLYZED. HEMOLYSIS MAY AFFECT INTEGRITY OF RESULTS.  GFRNONAA SPECIMEN HEMOLYZED. HEMOLYSIS MAY AFFECT INTEGRITY OF RESULTS.  GFRAA SPECIMEN HEMOLYZED. HEMOLYSIS MAY AFFECT INTEGRITY OF RESULTS.     LIVER FUNCTION TESTS: No results for input(s): "BILITOT", "AST", "ALT", "ALKPHOS", "PROT", "ALBUMIN" in the last 8760 hours.  TUMOR MARKERS: No results for input(s): "AFPTM", "CEA", "CA199", "CHROMGRNA" in the last 8760 hours.  Assessment and Plan: 77 y.o. female with low back pain and bilateral sacral insufficiency fx who presents for bilateral sacroplasty with Dr. Estanislado Pandy.   NPO since 0600 -- plan to proceed with procedure around 2 pm VSS  Risks and benefits of sacroplasty were discussed with the patient including, but not limited to education regarding the natural healing process of compression fractures without intervention, bleeding, infection, cement migration which may cause spinal cord damage, paralysis, pulmonary embolism or even death.  This interventional procedure involves the use of X-rays and because of the nature of the planned procedure, it is possible that we will have prolonged use of X-ray fluoroscopy.  Potential radiation risks to you include (but are not limited to) the following: - A slightly elevated risk for cancer  several years later in life. This risk is typically less than 0.5% percent. This risk is low in comparison to the normal incidence of human cancer, which is 33% for women and 50% for men according to the [place]. - Radiation induced injury can include skin redness, resembling a rash, tissue breakdown / ulcers and hair loss (which can be temporary or permanent).   The likelihood of either of these occurring depends on the difficulty of the procedure and whether you are  sensitive to radiation due to previous procedures, disease, or genetic conditions.  IF your procedure requires a prolonged use of radiation, you will be notified and given written instructions for further action.  It is your responsibility to monitor the irradiated area for the 2 weeks following the procedure and to notify your physician if you are concerned that you have suffered a radiation induced injury.    All of the patient's questions were answered, patient is agreeable to proceed.  Consent signed and in chart.  Thank you for this interesting consult.  I greatly enjoyed meeting Abra Christine Poffenberger and look forward to participating in their care.  A copy of this report was sent to the requesting provider on this date.  Electronically Signed: Joaquim Nam, PA-C 12/17/2022, 10:30 AM   I spent a total of  30 Minutes   in face to face in clinical consultation, greater than 50% of which was counseling/coordinating care for bilateral sacroplasty.  This chart was dictated using voice recognition software.  Despite best efforts to proofread,  errors can occur which can change the documentation meaning.

## 2022-12-20 LAB — SURGICAL PATHOLOGY

## 2022-12-26 DIAGNOSIS — Z1212 Encounter for screening for malignant neoplasm of rectum: Secondary | ICD-10-CM | POA: Diagnosis not present

## 2022-12-26 DIAGNOSIS — E785 Hyperlipidemia, unspecified: Secondary | ICD-10-CM | POA: Diagnosis not present

## 2022-12-26 DIAGNOSIS — M8589 Other specified disorders of bone density and structure, multiple sites: Secondary | ICD-10-CM | POA: Diagnosis not present

## 2022-12-26 DIAGNOSIS — M353 Polymyalgia rheumatica: Secondary | ICD-10-CM | POA: Diagnosis not present

## 2022-12-26 DIAGNOSIS — K5730 Diverticulosis of large intestine without perforation or abscess without bleeding: Secondary | ICD-10-CM | POA: Diagnosis not present

## 2022-12-26 DIAGNOSIS — I10 Essential (primary) hypertension: Secondary | ICD-10-CM | POA: Diagnosis not present

## 2022-12-31 DIAGNOSIS — M8580 Other specified disorders of bone density and structure, unspecified site: Secondary | ICD-10-CM | POA: Diagnosis not present

## 2022-12-31 DIAGNOSIS — Z7952 Long term (current) use of systemic steroids: Secondary | ICD-10-CM | POA: Diagnosis not present

## 2022-12-31 DIAGNOSIS — M1991 Primary osteoarthritis, unspecified site: Secondary | ICD-10-CM | POA: Diagnosis not present

## 2022-12-31 DIAGNOSIS — M353 Polymyalgia rheumatica: Secondary | ICD-10-CM | POA: Diagnosis not present

## 2023-01-02 DIAGNOSIS — Z0000 Encounter for general adult medical examination without abnormal findings: Secondary | ICD-10-CM | POA: Diagnosis not present

## 2023-01-02 DIAGNOSIS — Z1331 Encounter for screening for depression: Secondary | ICD-10-CM | POA: Diagnosis not present

## 2023-01-02 DIAGNOSIS — Z1339 Encounter for screening examination for other mental health and behavioral disorders: Secondary | ICD-10-CM | POA: Diagnosis not present

## 2023-01-02 DIAGNOSIS — R82998 Other abnormal findings in urine: Secondary | ICD-10-CM | POA: Diagnosis not present

## 2023-01-02 DIAGNOSIS — I739 Peripheral vascular disease, unspecified: Secondary | ICD-10-CM | POA: Diagnosis not present

## 2023-01-02 DIAGNOSIS — I4510 Unspecified right bundle-branch block: Secondary | ICD-10-CM | POA: Diagnosis not present

## 2023-01-02 DIAGNOSIS — E785 Hyperlipidemia, unspecified: Secondary | ICD-10-CM | POA: Diagnosis not present

## 2023-01-02 DIAGNOSIS — G459 Transient cerebral ischemic attack, unspecified: Secondary | ICD-10-CM | POA: Diagnosis not present

## 2023-01-02 DIAGNOSIS — Z23 Encounter for immunization: Secondary | ICD-10-CM | POA: Diagnosis not present

## 2023-01-08 DIAGNOSIS — Z4689 Encounter for fitting and adjustment of other specified devices: Secondary | ICD-10-CM | POA: Diagnosis not present

## 2023-01-20 ENCOUNTER — Other Ambulatory Visit: Payer: Self-pay | Admitting: Internal Medicine

## 2023-01-20 DIAGNOSIS — Z1231 Encounter for screening mammogram for malignant neoplasm of breast: Secondary | ICD-10-CM

## 2023-01-29 ENCOUNTER — Other Ambulatory Visit (HOSPITAL_COMMUNITY): Payer: Self-pay

## 2023-02-03 ENCOUNTER — Ambulatory Visit (HOSPITAL_COMMUNITY)
Admission: RE | Admit: 2023-02-03 | Discharge: 2023-02-03 | Disposition: A | Discharge status: Home / Self Care | Payer: Medicare Other | Source: Ambulatory Visit | Attending: Internal Medicine | Admitting: Internal Medicine

## 2023-02-03 DIAGNOSIS — M810 Age-related osteoporosis without current pathological fracture: Secondary | ICD-10-CM | POA: Diagnosis not present

## 2023-02-03 MED ORDER — DENOSUMAB 60 MG/ML ~~LOC~~ SOSY
PREFILLED_SYRINGE | SUBCUTANEOUS | Status: AC
  Filled 2023-02-03: qty 1

## 2023-02-03 MED ORDER — DENOSUMAB 60 MG/ML ~~LOC~~ SOSY
60.0000 mg | PREFILLED_SYRINGE | Freq: Once | SUBCUTANEOUS | Status: AC
  Administered 2023-02-03: 60 mg via SUBCUTANEOUS

## 2023-04-02 ENCOUNTER — Other Ambulatory Visit (HOSPITAL_COMMUNITY): Payer: Self-pay | Admitting: *Deleted

## 2023-04-07 ENCOUNTER — Ambulatory Visit (HOSPITAL_COMMUNITY)
Admission: RE | Admit: 2023-04-07 | Discharge: 2023-04-07 | Disposition: A | Discharge status: Home / Self Care | Payer: Medicare Other | Source: Ambulatory Visit | Attending: Internal Medicine | Admitting: Internal Medicine

## 2023-04-07 DIAGNOSIS — E785 Hyperlipidemia, unspecified: Secondary | ICD-10-CM | POA: Insufficient documentation

## 2023-04-07 MED ORDER — INCLISIRAN SODIUM 284 MG/1.5ML ~~LOC~~ SOSY
PREFILLED_SYRINGE | SUBCUTANEOUS | Status: AC
  Administered 2023-04-07: 284 mg
  Filled 2023-04-07: qty 1.5

## 2023-04-07 MED ORDER — INCLISIRAN SODIUM 284 MG/1.5ML ~~LOC~~ SOSY: 284.0000 mg | PREFILLED_SYRINGE | Freq: Once | SUBCUTANEOUS | Status: DC

## 2023-04-08 DIAGNOSIS — I10 Essential (primary) hypertension: Secondary | ICD-10-CM | POA: Diagnosis not present

## 2023-04-08 DIAGNOSIS — M810 Age-related osteoporosis without current pathological fracture: Secondary | ICD-10-CM | POA: Diagnosis not present

## 2023-04-08 DIAGNOSIS — E785 Hyperlipidemia, unspecified: Secondary | ICD-10-CM | POA: Diagnosis not present

## 2023-04-08 DIAGNOSIS — M1990 Unspecified osteoarthritis, unspecified site: Secondary | ICD-10-CM | POA: Diagnosis not present

## 2023-04-09 DIAGNOSIS — N812 Incomplete uterovaginal prolapse: Secondary | ICD-10-CM | POA: Diagnosis not present

## 2023-04-09 DIAGNOSIS — N952 Postmenopausal atrophic vaginitis: Secondary | ICD-10-CM | POA: Diagnosis not present

## 2023-04-09 DIAGNOSIS — Z4689 Encounter for fitting and adjustment of other specified devices: Secondary | ICD-10-CM | POA: Diagnosis not present

## 2023-06-06 ENCOUNTER — Ambulatory Visit
Admission: EM | Admit: 2023-06-06 | Discharge: 2023-06-06 | Disposition: A | Discharge status: Home / Self Care | Payer: Medicare Other

## 2023-06-06 ENCOUNTER — Encounter: Payer: Self-pay | Admitting: Physician Assistant

## 2023-06-06 DIAGNOSIS — M79675 Pain in left toe(s): Secondary | ICD-10-CM

## 2023-06-06 NOTE — ED Triage Notes (Signed)
Patient has intact blister on her left great toe. X 2 weeks. No pain just pressure.

## 2023-06-06 NOTE — Discharge Instructions (Signed)
  Please follow up with podiatry. Try OTC cushions to relieve pressure and hopefully decrease friction/ size of lesion.

## 2023-06-06 NOTE — ED Provider Notes (Signed)
[facility]    CSN: [id number] Arrival date & time: 06/06/23  1023      History   Chief Complaint Chief Complaint  Patient presents with   Blister    HPI Judith [name] is a 77 y.o. female.   Patient here today for evaluation of swelling and mild discomfort to her left great toe.  Swelling is located between her first and second toe.  She has not had any known injury.  She states symptoms have been present for 2 weeks.  The history is provided by the patient.    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   Vertigo 05/07/2014   Hyperglycemia 05/07/2014   GERD (gastroesophageal reflux disease) 05/07/2014   Glaucoma 05/07/2014   Osteoarthritis    Bruit 07/04/2011   HTN (hypertension) 07/04/2011   Mixed hyperlipidemia 07/04/2011   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   Palpitations 07/04/2011    Past Surgical History:  Procedure Laterality Date   BREAST BIOPSY Left    10+ yrs ago, benign   CATARACT EXTRACTION Bilateral 2014   done 3 weeks apart   DACRORHINOCYSTOTOMY Left 03/22/2022   Procedure: DACROCYSTORHINOSTOMY (DCR);  Surgeon: Dairl Ponder, MD;  Location: [facility] OR;  Service: Ophthalmology;  Laterality: Left;   IR SACROPLASTY BILATERAL  12/17/2022   LACRIMAL DUCT EXPLORATION N/A 03/22/2022   Procedure: LACRIMAL DUCT EXPLORATION;  Surgeon: Dairl Ponder, MD;  Location: [facility] OR;  Service: Ophthalmology;  Laterality: N/A;   LACRIMAL TUBE INSERTION Left 03/22/2022   Procedure: CRAWFIRD STENT INSERTION;   Surgeon: Dairl Ponder, MD;  Location: [facility] OR;  Service: Ophthalmology;  Laterality: Left;   OOPHORECTOMY Left 1969   secondary to dermoid   ROTATOR CUFF REPAIR Right 09/2002    OB History     Gravida  3   Para  2   Term  2   Preterm      AB  1   Living  2      SAB      IAB      Ectopic      Multiple      Live Births  2            Home Medications    Prior to Admission medications   Medication Sig Start Date End Date Taking? Authorizing Provider  aspirin EC 325 MG tablet Take 1 tablet (325 mg total) by mouth daily. 03/24/22   Dairl Ponder, MD  b complex vitamins capsule Take 1 capsule by mouth daily.    [provider]  cholecalciferol (VITAMIN D3) 25 MCG (1000 UNIT) tablet Take 1,000 Units by mouth daily.    [provider]  ezetimibe (ZETIA) 10 MG tablet Take 10 mg by mouth at bedtime. 01/21/22   [provider]  ibandronate (BONIVA) 150 MG tablet TAKE 1 TABLET BY MOUTH ONCE A MONTH 04/17/19   [provider]  irbesartan (AVAPRO) 75 MG tablet Take  37.5 mg by mouth at bedtime. 02/14/22   [provider]  latanoprost (XALATAN) 0.005 % ophthalmic solution Place 1 drop into both eyes at bedtime.      [provider]  Multiple Vitamins-Minerals (PRESERVISION AREDS 2+MULTI VIT PO) Take 1 Capful by mouth daily.    [provider]  simvastatin (ZOCOR) 40 MG tablet Take 40 mg by mouth daily. 03/30/19   [provider]  timolol (TIMOPTIC) 0.5 % ophthalmic solution Place 1 drop into both eyes every morning. 12/25/21   [provider]    Family History Family History  Problem Relation Age of Onset   Cancer Mother 66       throat   Hypertension Father    Heart disease Father 70   Prostate cancer Brother 77   Hypertension Brother    Liver cancer Maternal Aunt    Cancer Maternal Aunt    Stomach cancer Maternal Grandfather    Cancer Maternal Grandfather    CAD Maternal Uncle      Social History Social History   Tobacco Use   Smoking status: Former    Current packs/day: 0.00    Average packs/day: 1 pack/day for 20.0 years (20.0 ttl pk-yrs)    Types: Cigarettes    Start date: 09/30/1958    Quit date: 09/30/1978    Years since quitting: 44.7   Smokeless tobacco: Never  Vaping Use   Vaping status: Never Used  Substance Use Topics   Alcohol use: Not Currently    Alcohol/week: 1.0 - 2.0 standard drink of alcohol    Types: 1 - 2 Standard drinks or equivalent per week    Comment: occassionally wine   Drug use: No     Allergies   Erythromycin, Penicillins, Plavix [clopidogrel bisulfate], and Sulfa antibiotics   Review of Systems Review of Systems  Constitutional:  Negative for chills and fever.  Eyes:  Negative for discharge and redness.  Gastrointestinal:  Negative for abdominal pain, nausea and vomiting.  Musculoskeletal:  Negative for arthralgias and joint swelling.  Skin:  Negative for color change and wound.     Physical Exam Triage Vital Signs ED Triage Vitals  Encounter Vitals Group     BP      Systolic BP Percentile      Diastolic BP Percentile      Pulse      Resp      Temp      Temp src      SpO2      Weight      Height      Head Circumference      Peak Flow      Pain Score      Pain Loc      Pain Education      Exclude from Growth Chart    No data found.  Updated Vital Signs BP (!) 154/85 (BP Location: Left Arm)   Pulse 68   Temp 98.3 F (36.8 C) (Oral)   Resp 18   Wt 148 lb (67.1 kg)   LMP 09/30/1993   SpO2 95%   BMI 25.40 kg/m   Physical Exam Vitals and nursing note reviewed.  Constitutional:      General: She is not in acute distress.    Appearance: Normal appearance. She is not ill-appearing.  HENT:     Head: Normocephalic and atraumatic.  Eyes:     Conjunctiva/sclera: Conjunctivae normal.  Cardiovascular:     Rate and Rhythm: Normal rate.  Pulmonary:     Effort: Pulmonary effort is normal.   Musculoskeletal:     Comments: Approximately 2 cm lesion noted to middle left great toe between great toe and second toe.  No erythema, bleeding or drainage.  Neurological:     Mental Status: She is alert.  Psychiatric:        Mood and Affect: Mood normal.        Behavior: Behavior normal.        Thought Content: Thought content normal.      UC Treatments / Results  Labs (all labs ordered are listed, but only abnormal results are displayed) Labs Reviewed - No data to display  EKG   Radiology No results found.  Procedures Procedures (including critical care time)  Medications Ordered in UC Medications - No data to display  Initial Impression / Assessment and Plan / UC Course  I have reviewed the triage vital signs and the nursing notes.  Pertinent labs & imaging results that were available during my care of the patient were reviewed by me and considered in my medical decision making (see chart for details).    Recommended cushion to hopefully decrease pressure and friction and follow up with podiatry. No sign of infection or other acute concerns.  Final Clinical Impressions(s) / UC Diagnoses   Final diagnoses:  Pain of left great toe     Discharge Instructions       Please follow up with podiatry. Try OTC cushions to relieve pressure and hopefully decrease friction/ size of lesion.      ED Prescriptions   None    PDMP not reviewed this encounter.   Tomi Bamberger, PA-C 06/06/23 1210

## 2023-06-15 DIAGNOSIS — N390 Urinary tract infection, site not specified: Secondary | ICD-10-CM | POA: Diagnosis not present

## 2023-06-24 DIAGNOSIS — H401132 Primary open-angle glaucoma, bilateral, moderate stage: Secondary | ICD-10-CM | POA: Diagnosis not present

## 2023-06-24 DIAGNOSIS — Z961 Presence of intraocular lens: Secondary | ICD-10-CM | POA: Diagnosis not present

## 2023-07-02 DIAGNOSIS — H401112 Primary open-angle glaucoma, right eye, moderate stage: Secondary | ICD-10-CM | POA: Diagnosis not present

## 2023-07-04 DIAGNOSIS — M79672 Pain in left foot: Secondary | ICD-10-CM | POA: Diagnosis not present

## 2023-07-04 DIAGNOSIS — R2242 Localized swelling, mass and lump, left lower limb: Secondary | ICD-10-CM | POA: Diagnosis not present

## 2023-07-09 DIAGNOSIS — H401122 Primary open-angle glaucoma, left eye, moderate stage: Secondary | ICD-10-CM | POA: Diagnosis not present

## 2023-07-22 DIAGNOSIS — M79672 Pain in left foot: Secondary | ICD-10-CM | POA: Diagnosis not present

## 2023-07-23 ENCOUNTER — Ambulatory Visit: Payer: Medicare Other

## 2023-07-23 ENCOUNTER — Ambulatory Visit
Admission: RE | Admit: 2023-07-23 | Discharge: 2023-07-23 | Disposition: A | Discharge status: Home / Self Care | Payer: Medicare Other | Source: Ambulatory Visit | Attending: Internal Medicine | Admitting: Internal Medicine

## 2023-07-23 DIAGNOSIS — Z1231 Encounter for screening mammogram for malignant neoplasm of breast: Secondary | ICD-10-CM | POA: Diagnosis not present

## 2023-07-29 DIAGNOSIS — R2242 Localized swelling, mass and lump, left lower limb: Secondary | ICD-10-CM | POA: Diagnosis not present

## 2023-08-13 ENCOUNTER — Other Ambulatory Visit: Payer: Self-pay

## 2023-08-13 DIAGNOSIS — D4819 Other specified neoplasm of uncertain behavior of connective and other soft tissue: Secondary | ICD-10-CM | POA: Diagnosis not present

## 2023-08-13 DIAGNOSIS — M67472 Ganglion, left ankle and foot: Secondary | ICD-10-CM | POA: Diagnosis not present

## 2023-08-13 DIAGNOSIS — R2242 Localized swelling, mass and lump, left lower limb: Secondary | ICD-10-CM | POA: Diagnosis not present

## 2023-08-15 LAB — SURGICAL PATHOLOGY

## 2023-08-19 DIAGNOSIS — Z4689 Encounter for fitting and adjustment of other specified devices: Secondary | ICD-10-CM | POA: Diagnosis not present

## 2023-08-19 DIAGNOSIS — N952 Postmenopausal atrophic vaginitis: Secondary | ICD-10-CM | POA: Diagnosis not present

## 2023-08-19 DIAGNOSIS — N812 Incomplete uterovaginal prolapse: Secondary | ICD-10-CM | POA: Diagnosis not present

## 2023-10-06 ENCOUNTER — Encounter (HOSPITAL_COMMUNITY): Payer: Medicare Other

## 2023-10-08 DIAGNOSIS — I10 Essential (primary) hypertension: Secondary | ICD-10-CM | POA: Diagnosis not present

## 2023-10-08 DIAGNOSIS — M353 Polymyalgia rheumatica: Secondary | ICD-10-CM | POA: Diagnosis not present

## 2023-10-08 DIAGNOSIS — R413 Other amnesia: Secondary | ICD-10-CM | POA: Diagnosis not present

## 2023-10-15 DIAGNOSIS — N390 Urinary tract infection, site not specified: Secondary | ICD-10-CM | POA: Diagnosis not present

## 2023-10-17 ENCOUNTER — Other Ambulatory Visit (HOSPITAL_COMMUNITY): Payer: Self-pay | Admitting: *Deleted

## 2023-10-20 ENCOUNTER — Ambulatory Visit (HOSPITAL_COMMUNITY)
Admission: RE | Admit: 2023-10-20 | Discharge: 2023-10-20 | Disposition: A | Discharge status: Home / Self Care | Payer: Medicare Other | Source: Ambulatory Visit | Attending: Internal Medicine | Admitting: Internal Medicine

## 2023-10-20 DIAGNOSIS — G459 Transient cerebral ischemic attack, unspecified: Secondary | ICD-10-CM | POA: Diagnosis not present

## 2023-10-20 DIAGNOSIS — E785 Hyperlipidemia, unspecified: Secondary | ICD-10-CM | POA: Diagnosis not present

## 2023-10-20 DIAGNOSIS — I6529 Occlusion and stenosis of unspecified carotid artery: Secondary | ICD-10-CM | POA: Insufficient documentation

## 2023-10-20 MED ORDER — INCLISIRAN SODIUM 284 MG/1.5ML ~~LOC~~ SOSY
284.0000 mg | PREFILLED_SYRINGE | Freq: Once | SUBCUTANEOUS | Status: AC
  Administered 2023-10-20: 284 mg via SUBCUTANEOUS

## 2023-10-20 MED ORDER — INCLISIRAN SODIUM 284 MG/1.5ML ~~LOC~~ SOSY
PREFILLED_SYRINGE | SUBCUTANEOUS | Status: AC
  Filled 2023-10-20: qty 1.5

## 2023-11-26 DIAGNOSIS — G319 Degenerative disease of nervous system, unspecified: Secondary | ICD-10-CM | POA: Diagnosis not present

## 2023-12-17 DIAGNOSIS — Z4689 Encounter for fitting and adjustment of other specified devices: Secondary | ICD-10-CM | POA: Diagnosis not present

## 2023-12-29 DIAGNOSIS — R2242 Localized swelling, mass and lump, left lower limb: Secondary | ICD-10-CM | POA: Diagnosis not present

## 2024-01-01 DIAGNOSIS — I10 Essential (primary) hypertension: Secondary | ICD-10-CM | POA: Diagnosis not present

## 2024-01-05 ENCOUNTER — Other Ambulatory Visit: Payer: Self-pay

## 2024-01-05 DIAGNOSIS — L97529 Non-pressure chronic ulcer of other part of left foot with unspecified severity: Secondary | ICD-10-CM | POA: Diagnosis not present

## 2024-01-05 DIAGNOSIS — D2122 Benign neoplasm of connective and other soft tissue of left lower limb, including hip: Secondary | ICD-10-CM | POA: Diagnosis not present

## 2024-01-07 LAB — SURGICAL PATHOLOGY

## 2024-01-08 DIAGNOSIS — G459 Transient cerebral ischemic attack, unspecified: Secondary | ICD-10-CM | POA: Diagnosis not present

## 2024-01-16 DIAGNOSIS — I6782 Cerebral ischemia: Secondary | ICD-10-CM | POA: Diagnosis not present

## 2024-01-16 DIAGNOSIS — I672 Cerebral atherosclerosis: Secondary | ICD-10-CM | POA: Diagnosis not present

## 2024-01-16 DIAGNOSIS — I6523 Occlusion and stenosis of bilateral carotid arteries: Secondary | ICD-10-CM | POA: Diagnosis not present

## 2024-01-16 DIAGNOSIS — R42 Dizziness and giddiness: Secondary | ICD-10-CM | POA: Diagnosis not present

## 2024-02-20 DIAGNOSIS — R519 Headache, unspecified: Secondary | ICD-10-CM | POA: Diagnosis not present

## 2024-02-20 DIAGNOSIS — R413 Other amnesia: Secondary | ICD-10-CM | POA: Diagnosis not present

## 2024-02-20 DIAGNOSIS — M542 Cervicalgia: Secondary | ICD-10-CM | POA: Diagnosis not present

## 2024-03-01 DIAGNOSIS — R413 Other amnesia: Secondary | ICD-10-CM | POA: Diagnosis not present

## 2024-03-01 DIAGNOSIS — Z8673 Personal history of transient ischemic attack (TIA), and cerebral infarction without residual deficits: Secondary | ICD-10-CM | POA: Diagnosis not present

## 2024-03-01 DIAGNOSIS — R519 Headache, unspecified: Secondary | ICD-10-CM | POA: Diagnosis not present

## 2024-03-03 DIAGNOSIS — M2021 Hallux rigidus, right foot: Secondary | ICD-10-CM | POA: Diagnosis not present

## 2024-03-03 DIAGNOSIS — M67472 Ganglion, left ankle and foot: Secondary | ICD-10-CM | POA: Diagnosis not present

## 2024-03-05 DIAGNOSIS — M542 Cervicalgia: Secondary | ICD-10-CM | POA: Diagnosis not present

## 2024-03-05 DIAGNOSIS — R293 Abnormal posture: Secondary | ICD-10-CM | POA: Diagnosis not present

## 2024-03-09 DIAGNOSIS — M542 Cervicalgia: Secondary | ICD-10-CM | POA: Diagnosis not present

## 2024-03-09 DIAGNOSIS — R293 Abnormal posture: Secondary | ICD-10-CM | POA: Diagnosis not present

## 2024-03-10 DIAGNOSIS — Z961 Presence of intraocular lens: Secondary | ICD-10-CM | POA: Diagnosis not present

## 2024-03-10 DIAGNOSIS — H401132 Primary open-angle glaucoma, bilateral, moderate stage: Secondary | ICD-10-CM | POA: Diagnosis not present

## 2024-03-15 DIAGNOSIS — S91302A Unspecified open wound, left foot, initial encounter: Secondary | ICD-10-CM | POA: Diagnosis not present

## 2024-03-15 DIAGNOSIS — R0989 Other specified symptoms and signs involving the circulatory and respiratory systems: Secondary | ICD-10-CM | POA: Diagnosis not present

## 2024-03-16 DIAGNOSIS — M542 Cervicalgia: Secondary | ICD-10-CM | POA: Diagnosis not present

## 2024-03-16 DIAGNOSIS — R293 Abnormal posture: Secondary | ICD-10-CM | POA: Diagnosis not present

## 2024-03-22 DIAGNOSIS — E785 Hyperlipidemia, unspecified: Secondary | ICD-10-CM | POA: Diagnosis not present

## 2024-03-22 DIAGNOSIS — M8580 Other specified disorders of bone density and structure, unspecified site: Secondary | ICD-10-CM | POA: Diagnosis not present

## 2024-03-23 DIAGNOSIS — R293 Abnormal posture: Secondary | ICD-10-CM | POA: Diagnosis not present

## 2024-03-23 DIAGNOSIS — M542 Cervicalgia: Secondary | ICD-10-CM | POA: Diagnosis not present

## 2024-03-24 DIAGNOSIS — S91302A Unspecified open wound, left foot, initial encounter: Secondary | ICD-10-CM | POA: Diagnosis not present

## 2024-03-24 DIAGNOSIS — R0989 Other specified symptoms and signs involving the circulatory and respiratory systems: Secondary | ICD-10-CM | POA: Diagnosis not present

## 2024-03-27 ENCOUNTER — Encounter (HOSPITAL_COMMUNITY): Payer: Self-pay | Admitting: Interventional Radiology

## 2024-03-29 ENCOUNTER — Telehealth (HOSPITAL_COMMUNITY): Payer: Self-pay | Admitting: Pharmacy Technician

## 2024-03-29 DIAGNOSIS — R82998 Other abnormal findings in urine: Secondary | ICD-10-CM | POA: Diagnosis not present

## 2024-03-29 DIAGNOSIS — Z0000 Encounter for general adult medical examination without abnormal findings: Secondary | ICD-10-CM | POA: Diagnosis not present

## 2024-03-29 DIAGNOSIS — G319 Degenerative disease of nervous system, unspecified: Secondary | ICD-10-CM | POA: Diagnosis not present

## 2024-03-29 DIAGNOSIS — I10 Essential (primary) hypertension: Secondary | ICD-10-CM | POA: Diagnosis not present

## 2024-03-29 NOTE — Telephone Encounter (Signed)
 Auth Submission: NO AUTH NEEDED Site of care: Site of care: [facility] Payer: BCBS MEDICARE Medication & CPT/J Code(s) submitted: Prolia  (Denosumab ) [id number] Diagnosis Code: M81.0 Route of submission (phone, fax, portal):  Phone # Fax # Auth type: Buy/Bill HB Units/visits requested: 60mg  x 2 doses Reference number: [id number]  Approval from: 03/29/24 to 09/29/24    Dagoberto Armour, CPhT [facility] [phone]

## 2024-03-30 DIAGNOSIS — Z4689 Encounter for fitting and adjustment of other specified devices: Secondary | ICD-10-CM | POA: Diagnosis not present

## 2024-03-31 DIAGNOSIS — R293 Abnormal posture: Secondary | ICD-10-CM | POA: Diagnosis not present

## 2024-03-31 DIAGNOSIS — M542 Cervicalgia: Secondary | ICD-10-CM | POA: Diagnosis not present

## 2024-04-01 ENCOUNTER — Telehealth (HOSPITAL_COMMUNITY): Payer: Self-pay | Admitting: Pharmacy Technician

## 2024-04-01 DIAGNOSIS — R519 Headache, unspecified: Secondary | ICD-10-CM | POA: Diagnosis not present

## 2024-04-01 NOTE — Telephone Encounter (Signed)
 Auth Submission: NO AUTH NEEDED Site of care: Site of care: [facility] Payer: BCBS Medicare Medication & CPT/J Code(s) submitted: Leqvio  (Inclisiran) J1306 Diagnosis Code: E78.5 Route of submission (phone, fax, portal): phone Phone # Fax # Auth type: Buy/Bill HB Units/visits requested: 284mg  q 6 months  Reference number: [id number]  Approval from: 04/01/24 to 09/29/24    Dagoberto Armour, CPhT [facility] [phone]

## 2024-04-06 DIAGNOSIS — M542 Cervicalgia: Secondary | ICD-10-CM | POA: Diagnosis not present

## 2024-04-06 DIAGNOSIS — R293 Abnormal posture: Secondary | ICD-10-CM | POA: Diagnosis not present

## 2024-04-13 DIAGNOSIS — M542 Cervicalgia: Secondary | ICD-10-CM | POA: Diagnosis not present

## 2024-04-13 DIAGNOSIS — R293 Abnormal posture: Secondary | ICD-10-CM | POA: Diagnosis not present

## 2024-04-14 ENCOUNTER — Other Ambulatory Visit (HOSPITAL_COMMUNITY): Payer: Self-pay | Admitting: *Deleted

## 2024-04-14 DIAGNOSIS — S91302D Unspecified open wound, left foot, subsequent encounter: Secondary | ICD-10-CM | POA: Diagnosis not present

## 2024-04-15 NOTE — Telephone Encounter (Signed)
 Spoke w/ pt and informed her of below.   She stated that she try the 4.6 and in ok w/ increasing to the 9.5

## 2024-04-19 ENCOUNTER — Ambulatory Visit (HOSPITAL_COMMUNITY)
Admission: RE | Admit: 2024-04-19 | Discharge: 2024-04-19 | Disposition: A | Discharge status: Home / Self Care | Payer: Medicare Other | Source: Ambulatory Visit | Attending: Internal Medicine | Admitting: Internal Medicine

## 2024-04-19 DIAGNOSIS — E782 Mixed hyperlipidemia: Secondary | ICD-10-CM | POA: Diagnosis not present

## 2024-04-19 DIAGNOSIS — M810 Age-related osteoporosis without current pathological fracture: Secondary | ICD-10-CM | POA: Diagnosis not present

## 2024-04-19 MED ORDER — INCLISIRAN SODIUM 284 MG/1.5ML ~~LOC~~ SOSY
284.0000 mg | PREFILLED_SYRINGE | SUBCUTANEOUS | Status: DC
  Administered 2024-04-19: 284 mg via SUBCUTANEOUS

## 2024-04-19 MED ORDER — DENOSUMAB 60 MG/ML ~~LOC~~ SOSY
60.0000 mg | PREFILLED_SYRINGE | Freq: Once | SUBCUTANEOUS | Status: AC
  Administered 2024-04-19: 60 mg via SUBCUTANEOUS

## 2024-04-19 MED ORDER — INCLISIRAN SODIUM 284 MG/1.5ML ~~LOC~~ SOSY
PREFILLED_SYRINGE | SUBCUTANEOUS | Status: AC
  Filled 2024-04-19: qty 1.5

## 2024-04-19 MED ORDER — DENOSUMAB 60 MG/ML ~~LOC~~ SOSY
PREFILLED_SYRINGE | SUBCUTANEOUS | Status: AC
  Filled 2024-04-19: qty 1

## 2024-06-10 DIAGNOSIS — S91302D Unspecified open wound, left foot, subsequent encounter: Secondary | ICD-10-CM | POA: Diagnosis not present

## 2024-06-10 DIAGNOSIS — M7989 Other specified soft tissue disorders: Secondary | ICD-10-CM | POA: Diagnosis not present

## 2024-06-10 DIAGNOSIS — M6740 Ganglion, unspecified site: Secondary | ICD-10-CM | POA: Diagnosis not present

## 2024-06-15 ENCOUNTER — Other Ambulatory Visit (HOSPITAL_COMMUNITY): Payer: Self-pay | Admitting: Internal Medicine

## 2024-06-15 DIAGNOSIS — M810 Age-related osteoporosis without current pathological fracture: Secondary | ICD-10-CM | POA: Insufficient documentation

## 2024-06-23 DIAGNOSIS — Z4689 Encounter for fitting and adjustment of other specified devices: Secondary | ICD-10-CM | POA: Diagnosis not present

## 2024-06-23 DIAGNOSIS — N812 Incomplete uterovaginal prolapse: Secondary | ICD-10-CM | POA: Diagnosis not present

## 2024-07-26 DIAGNOSIS — M7989 Other specified soft tissue disorders: Secondary | ICD-10-CM | POA: Diagnosis not present

## 2024-07-29 DIAGNOSIS — L03031 Cellulitis of right toe: Secondary | ICD-10-CM | POA: Diagnosis not present

## 2024-07-29 DIAGNOSIS — R413 Other amnesia: Secondary | ICD-10-CM | POA: Diagnosis not present

## 2024-08-04 DIAGNOSIS — M25551 Pain in right hip: Secondary | ICD-10-CM | POA: Diagnosis not present

## 2024-08-04 DIAGNOSIS — M545 Low back pain, unspecified: Secondary | ICD-10-CM | POA: Diagnosis not present

## 2024-08-04 DIAGNOSIS — M25552 Pain in left hip: Secondary | ICD-10-CM | POA: Diagnosis not present

## 2024-08-05 DIAGNOSIS — Z9889 Other specified postprocedural states: Secondary | ICD-10-CM | POA: Diagnosis not present

## 2024-08-19 NOTE — Progress Notes (Signed)
 Judith [name]                                          MRN: [id number]   08/19/2024   The [facility] Quality Team Specialist reviewed this patient medical record for the purposes of chart review for care gap closure. The following were reviewed: abstraction for care gap closure-controlling blood pressure.    [facility] Quality Team

## 2024-09-02 DIAGNOSIS — K080 Exfoliation of teeth due to systemic causes: Secondary | ICD-10-CM | POA: Diagnosis not present

## 2024-09-04 ENCOUNTER — Encounter (HOSPITAL_BASED_OUTPATIENT_CLINIC_OR_DEPARTMENT_OTHER): Payer: Self-pay

## 2024-09-04 ENCOUNTER — Ambulatory Visit (HOSPITAL_BASED_OUTPATIENT_CLINIC_OR_DEPARTMENT_OTHER): Admission: EM | Admit: 2024-09-04 | Discharge: 2024-09-04 | Disposition: A | Discharge status: Home / Self Care

## 2024-09-04 DIAGNOSIS — R197 Diarrhea, unspecified: Secondary | ICD-10-CM

## 2024-09-04 NOTE — Discharge Instructions (Signed)
  1. Intractable diarrhea (Primary) - Stool culture; Future - Gastrointestinal Panel by PCR , Stool; Future - Stool collection kit sent home with patient for specimen collection.  After collecting specimen return to clinic with sample to send to lab. - Once stool specimen is collected, results should be available in 2 to 3 days via MyChart. - Continue using Imodium as directed to control diarrhea symptoms.  Take 1 dose following liquid stool, initial dose 2 pills can be taken.  No more than 4 total tablets within 24 hours.  -Continue to monitor symptoms for any change in severity if there is any escalation of current symptoms or development of new symptoms follow-up in ER for further evaluation and management.

## 2024-09-04 NOTE — ED Provider Notes (Signed)
 [facility] [place] [facility]  Note:  This document was prepared using Conservation officer, historic buildings and may include unintentional dictation errors.  MRN: [id number] DOB: [birth year]  Subjective:   Judith [name] is a 78 y.o. female presenting for evaluation of intractable diarrhea with mild abdominal cramping x 3 days.  Patient reports that she has had very watery stool for approximately 3 to 4 days, 4-5 episodes per day.  Patient denies any vomiting or nausea, severe abdominal pain, flank pain, dysuria, increased urinary frequency.  Patient reports that she did take 1 dose of Imodium last night overnight which did not seem to improve symptoms.  Patient denies any introduction of new foods or new medications.  No shortness of breath, chest pain, weakness, dizziness, blurred vision.  No current facility-administered medications for this encounter.  Current Outpatient Medications:    rivastigmine (EXELON) 4.6 mg/24hr, Place 4.6 mg onto the skin daily., Disp: , Rfl:    aspirin  EC 325 MG tablet, Take 1 tablet (325 mg total) by mouth daily., Disp: , Rfl:    b complex vitamins capsule, Take 1 capsule by mouth daily., Disp: , Rfl:    cholecalciferol (VITAMIN D3) 25 MCG (1000 UNIT) tablet, Take 1,000 Units by mouth daily., Disp: , Rfl:    denosumab  (PROLIA ) 60 MG/ML SOSY injection, Inject 60 mg into the skin every 6 (six) months., Disp: , Rfl:    ezetimibe (ZETIA) 10 MG tablet, Take 10 mg by mouth at bedtime., Disp: , Rfl:    ibandronate (BONIVA) 150 MG tablet, TAKE 1 TABLET BY MOUTH ONCE A MONTH, Disp: , Rfl:    irbesartan (AVAPRO) 75 MG tablet, Take 37.5 mg by mouth at bedtime., Disp: , Rfl:    latanoprost  (XALATAN ) 0.005 % ophthalmic solution, Place 1 drop into both eyes at bedtime.  , Disp: , Rfl:    Multiple Vitamins-Minerals (PRESERVISION AREDS 2+MULTI VIT PO), Take 1 Capful by mouth daily., Disp: , Rfl:    simvastatin  (ZOCOR ) 40 MG tablet, Take 40 mg by mouth daily., Disp: , Rfl:     timolol  (TIMOPTIC ) 0.5 % ophthalmic solution, Place 1 drop into both eyes every morning., Disp: , Rfl:    Allergies  Allergen Reactions   Erythromycin Other (See Comments)    GI upset   Penicillins Rash   Plavix  [Clopidogrel  Bisulfate] Rash    Spontaneous bruising, headaches   Sulfa Antibiotics Rash    Past Medical History:  Diagnosis Date   Arthritis    Carotid bruit    Cervical spondylosis    Elevated lipids    GERD (gastroesophageal reflux disease)    Glaucoma 2011   Hyperlipidemia    Hypertension    Osteoarthritis    Osteopenia    RBBB (right bundle branch block)    Symptomatic PVCs    Thoracic scoliosis    since teenager   Vertigo      Past Surgical History:  Procedure Laterality Date   BREAST BIOPSY Left    10+ yrs ago, benign   CATARACT EXTRACTION Bilateral 2014   done 3 weeks apart   DACRORHINOCYSTOTOMY Left 03/22/2022   Procedure: DACROCYSTORHINOSTOMY (DCR);  Surgeon: Rodgers Faden, MD;  Location: [facility] OR;  Service: Ophthalmology;  Laterality: Left;   IR SACROPLASTY BILATERAL  12/17/2022   LACRIMAL DUCT EXPLORATION N/A 03/22/2022   Procedure: LACRIMAL DUCT EXPLORATION;  Surgeon: Rodgers Faden, MD;  Location: [facility] OR;  Service: Ophthalmology;  Laterality: N/A;   LACRIMAL TUBE INSERTION Left 03/22/2022   Procedure: CRAWFIRD STENT INSERTION;  Surgeon: Rodgers Faden, MD;  Location: [facility] OR;  Service: Ophthalmology;  Laterality: Left;   OOPHORECTOMY Left 1969   secondary to dermoid   ROTATOR CUFF REPAIR Right 09/2002    Family History  Problem Relation Age of Onset   Cancer Mother 28       throat   Hypertension Father    Heart disease Father 71   Prostate cancer Brother 53   Hypertension Brother    Liver cancer Maternal Aunt    Cancer Maternal Aunt    Stomach cancer Maternal Grandfather    Cancer Maternal Grandfather    CAD Maternal Uncle     Social History   Tobacco Use   Smoking status: Former    Current packs/day: 0.00    Average packs/day: 1  pack/day for 20.0 years (20.0 ttl pk-yrs)    Types: Cigarettes    Start date: 09/30/1958    Quit date: 09/30/1978    Years since quitting: 45.9   Smokeless tobacco: Never  Vaping Use   Vaping status: Never Used  Substance Use Topics   Alcohol  use: Not Currently    Alcohol /week: 1.0 - 2.0 standard drink of alcohol     Types: 1 - 2 Standard drinks or equivalent per week    Comment: occassionally wine   Drug use: No    ROS Refer to HPI for ROS details.  Objective:    Vitals: BP (!) 165/65 (BP Location: Right Arm)   Pulse 70   Temp 98 F (36.7 C) (Oral)   Resp 20   LMP 09/30/1993   SpO2 96%   Physical Exam Vitals and nursing note reviewed.  Constitutional:      General: She is not in acute distress.    Appearance: Normal appearance. She is well-developed. She is not ill-appearing or toxic-appearing.  HENT:     Head: Normocephalic and atraumatic.  Cardiovascular:     Rate and Rhythm: Normal rate.  Pulmonary:     Effort: Pulmonary effort is normal. No respiratory distress.     Breath sounds: No stridor. No wheezing.  Abdominal:     General: There is no distension.     Tenderness: There is no abdominal tenderness. There is no right CVA tenderness or left CVA tenderness.  Skin:    General: Skin is warm and dry.  Neurological:     General: No focal deficit present.     Mental Status: She is alert and oriented to person, place, and time.  Psychiatric:        Mood and Affect: Mood normal.        Behavior: Behavior normal.     Procedures  No results found for this or any previous visit (from the past 24 hours).  Assessment and Plan :     Discharge Instructions       1. Intractable diarrhea (Primary) - Stool culture; Future - Gastrointestinal Panel by PCR , Stool; Future - Stool collection kit sent home with patient for specimen collection.  After collecting specimen return to clinic with sample to send to lab. - Once stool specimen is collected, results should  be available in 2 to 3 days via MyChart. - Continue using Imodium as directed to control diarrhea symptoms.  Take 1 dose following liquid stool, initial dose 2 pills can be taken.  No more than 4 total tablets within 24 hours.  -Continue to monitor symptoms for any change in severity if there is any escalation of current symptoms or development of new symptoms  follow-up in ER for further evaluation and management.      Xue Low B Trish Mancinelli   Jericca Russett, [place] B, [place] 09/04/24 1045

## 2024-09-04 NOTE — ED Triage Notes (Signed)
 Diarrhea x 3 days. Having approx 4 episodes of watery stool daily. Mild abd discomfort. No vomiting. Reports no unusual food recently. Denies known fever. Took a dose of immodium last night. Denies urinary frequency, burning. Able to eat and drink without difficulty. States has a headache as well since diarrhea has started.

## 2024-09-08 ENCOUNTER — Other Ambulatory Visit: Payer: Self-pay | Admitting: Internal Medicine

## 2024-09-08 DIAGNOSIS — Z1231 Encounter for screening mammogram for malignant neoplasm of breast: Secondary | ICD-10-CM

## 2024-09-09 DIAGNOSIS — I10 Essential (primary) hypertension: Secondary | ICD-10-CM | POA: Diagnosis not present

## 2024-09-09 DIAGNOSIS — K080 Exfoliation of teeth due to systemic causes: Secondary | ICD-10-CM | POA: Diagnosis not present

## 2024-09-09 DIAGNOSIS — R197 Diarrhea, unspecified: Secondary | ICD-10-CM | POA: Diagnosis not present

## 2024-09-09 DIAGNOSIS — J189 Pneumonia, unspecified organism: Secondary | ICD-10-CM | POA: Diagnosis not present

## 2024-09-09 DIAGNOSIS — R0789 Other chest pain: Secondary | ICD-10-CM | POA: Diagnosis not present

## 2024-10-06 ENCOUNTER — Ambulatory Visit
Admission: RE | Admit: 2024-10-06 | Discharge: 2024-10-06 | Disposition: A | Discharge status: Home / Self Care | Source: Ambulatory Visit | Attending: Internal Medicine | Admitting: Internal Medicine

## 2024-10-06 DIAGNOSIS — Z1231 Encounter for screening mammogram for malignant neoplasm of breast: Secondary | ICD-10-CM

## 2024-10-11 ENCOUNTER — Telehealth (HOSPITAL_COMMUNITY): Payer: Self-pay

## 2024-10-11 ENCOUNTER — Encounter (HOSPITAL_COMMUNITY)

## 2024-10-11 NOTE — Telephone Encounter (Signed)
 Auth Submission: APPROVED Site of care: Site of care: [facility] Payer: BCBS Medicare Medication & CPT/J Code(s) submitted: Prolia  (Denosumab ) [id number] Diagnosis Code: M81.0 Route of submission (phone, fax, portal): fax Phone # Fax # Auth type: Buy/Bill HB Units/visits requested: 60mg  q12months Reference number: [id number] Approval from: 10/07/24 to 10/07/25    Approval letter has been scanned into media tab.

## 2024-10-19 ENCOUNTER — Other Ambulatory Visit (HOSPITAL_COMMUNITY): Payer: Self-pay | Admitting: Internal Medicine

## 2024-10-19 NOTE — Progress Notes (Signed)
 Referral for Leqvio  received. Attached to prior referral  Sherry Pennant, PharmD, MPH, BCPS, CPP Clinical Pharmacist

## 2024-10-21 ENCOUNTER — Ambulatory Visit (HOSPITAL_COMMUNITY)
Admission: RE | Admit: 2024-10-21 | Discharge: 2024-10-21 | Disposition: A | Discharge status: Home / Self Care | Source: Ambulatory Visit | Attending: Internal Medicine | Admitting: Internal Medicine

## 2024-10-21 DIAGNOSIS — E782 Mixed hyperlipidemia: Secondary | ICD-10-CM | POA: Insufficient documentation

## 2024-10-21 DIAGNOSIS — M810 Age-related osteoporosis without current pathological fracture: Secondary | ICD-10-CM | POA: Insufficient documentation

## 2024-10-21 MED ORDER — INCLISIRAN SODIUM 284 MG/1.5ML ~~LOC~~ SOSY
284.0000 mg | PREFILLED_SYRINGE | Freq: Once | SUBCUTANEOUS | Status: AC
  Administered 2024-10-21: 284 mg via SUBCUTANEOUS

## 2024-10-21 MED ORDER — INCLISIRAN SODIUM 284 MG/1.5ML ~~LOC~~ SOSY
PREFILLED_SYRINGE | SUBCUTANEOUS | Status: AC
  Filled 2024-10-21: qty 1.5

## 2024-10-21 MED ORDER — DENOSUMAB 60 MG/ML ~~LOC~~ SOSY
60.0000 mg | PREFILLED_SYRINGE | Freq: Once | SUBCUTANEOUS | Status: AC
  Administered 2024-10-21: 60 mg via SUBCUTANEOUS

## 2024-10-21 MED ORDER — DENOSUMAB 60 MG/ML ~~LOC~~ SOSY
PREFILLED_SYRINGE | SUBCUTANEOUS | Status: AC
  Filled 2024-10-21: qty 1

## 2024-10-28 ENCOUNTER — Encounter (HOSPITAL_COMMUNITY)

## 2025-04-21 ENCOUNTER — Encounter (HOSPITAL_COMMUNITY)
